# Patient Record
Sex: Female | Born: 1985 | Race: Black or African American | Hispanic: No | Marital: Single | Smoking: Never smoker
Health system: Southern US, Community
[De-identification: ages and names within clinical notes are randomized; demographics above are authoritative.]

## PROBLEM LIST (undated history)

## (undated) DIAGNOSIS — F419 Anxiety disorder, unspecified: Secondary | ICD-10-CM

## (undated) DIAGNOSIS — I10 Essential (primary) hypertension: Secondary | ICD-10-CM

## (undated) HISTORY — PX: APPENDECTOMY: SHX54

---

## 2005-09-15 ENCOUNTER — Other Ambulatory Visit: Admission: RE | Admit: 2005-09-15 | Discharge: 2005-09-15 | Payer: Self-pay | Admitting: Gynecology

## 2006-10-11 ENCOUNTER — Other Ambulatory Visit: Admission: RE | Admit: 2006-10-11 | Discharge: 2006-10-11 | Payer: Self-pay | Admitting: Gynecology

## 2007-01-25 ENCOUNTER — Emergency Department (HOSPITAL_COMMUNITY): Admission: EM | Admit: 2007-01-25 | Discharge: 2007-01-25 | Payer: Self-pay | Admitting: Emergency Medicine

## 2007-04-30 ENCOUNTER — Other Ambulatory Visit: Admission: RE | Admit: 2007-04-30 | Discharge: 2007-04-30 | Payer: Self-pay | Admitting: Obstetrics and Gynecology

## 2007-10-21 ENCOUNTER — Other Ambulatory Visit: Admission: RE | Admit: 2007-10-21 | Discharge: 2007-10-21 | Payer: Self-pay | Admitting: Gynecology

## 2008-06-12 ENCOUNTER — Encounter: Payer: Self-pay | Admitting: Gynecology

## 2008-06-12 ENCOUNTER — Ambulatory Visit: Payer: Self-pay | Admitting: Gynecology

## 2008-06-12 ENCOUNTER — Other Ambulatory Visit: Admission: RE | Admit: 2008-06-12 | Discharge: 2008-06-12 | Payer: Self-pay | Admitting: Gynecology

## 2008-06-26 ENCOUNTER — Ambulatory Visit: Payer: Self-pay | Admitting: Women's Health

## 2010-03-10 ENCOUNTER — Inpatient Hospital Stay (HOSPITAL_COMMUNITY): Admission: AD | Admit: 2010-03-10 | Discharge: 2010-03-10 | Payer: Self-pay | Admitting: Obstetrics and Gynecology

## 2010-07-29 ENCOUNTER — Emergency Department (HOSPITAL_BASED_OUTPATIENT_CLINIC_OR_DEPARTMENT_OTHER): Admission: EM | Admit: 2010-07-29 | Discharge: 2010-07-29 | Payer: Self-pay | Admitting: Emergency Medicine

## 2010-09-15 ENCOUNTER — Inpatient Hospital Stay (HOSPITAL_COMMUNITY): Admission: AD | Admit: 2010-09-15 | Discharge: 2010-04-02 | Payer: Self-pay | Admitting: Obstetrics and Gynecology

## 2010-12-21 LAB — RAPID STREP SCREEN (MED CTR MEBANE ONLY): Streptococcus, Group A Screen (Direct): NEGATIVE

## 2010-12-25 LAB — CBC
HCT: 34.9 % — ABNORMAL LOW (ref 36.0–46.0)
Hemoglobin: 13.4 g/dL (ref 12.0–15.0)
MCHC: 33.9 g/dL (ref 30.0–36.0)
MCV: 96.3 fL (ref 78.0–100.0)
Platelets: 171 10*3/uL (ref 150–400)
RBC: 3.62 MIL/uL — ABNORMAL LOW (ref 3.87–5.11)
RBC: 4.16 MIL/uL (ref 3.87–5.11)
WBC: 11.7 10*3/uL — ABNORMAL HIGH (ref 4.0–10.5)

## 2010-12-25 LAB — RPR: RPR Ser Ql: NONREACTIVE

## 2010-12-25 LAB — RH IMMUNE GLOB WKUP(>/=20WKS)(NOT WOMEN'S HOSP)

## 2012-01-18 ENCOUNTER — Ambulatory Visit (INDEPENDENT_AMBULATORY_CARE_PROVIDER_SITE_OTHER): Payer: BC Managed Care – PPO | Admitting: Physician Assistant

## 2012-01-18 VITALS — BP 118/75 | HR 82 | Temp 98.3°F | Resp 16 | Ht 65.25 in | Wt 202.4 lb

## 2012-01-18 DIAGNOSIS — H10029 Other mucopurulent conjunctivitis, unspecified eye: Secondary | ICD-10-CM

## 2012-01-18 DIAGNOSIS — H109 Unspecified conjunctivitis: Secondary | ICD-10-CM

## 2012-01-18 MED ORDER — OFLOXACIN 0.3 % OP SOLN: 1.0000 [drp] | Freq: Four times a day (QID) | OPHTHALMIC | Status: AC

## 2012-01-18 NOTE — Progress Notes (Signed)
  Subjective:    Patient ID: Amy Francis, female    DOB: September 24, 1986, 26 y.o.   MRN: [id number]  HPI Presents with 6 days of right eye pain, swelling, greenish/yellow drainage.  Awoke from nap with daughter (almost 27 years old) with matted eye.  "I knew it was pink eye."  Sister had drops left over from recent infection, which she used, but ran out, and symptoms recurred, now also in left eye.  No photophobia, visual change.  No URI-type symptoms. No FB history. Review of Systems As above.    Objective:   Physical Exam  Vital signs noted. Well-developed, well nourished BF who is awake, alert and oriented, in NAD. HEENT: [place]/AT, PERRL, EOMI.  Sclera and conjunctiva are injected.  Crusting on the lashes, L>R. Funduscopic exam normal bilaterally.  EAC are patent, TMs are normal in appearance. Nasal mucosa is pink and moist. OP is clear. Neck: supple, non-tender, no lymphadenopathy, thyromegaly.     Assessment & Plan:   1. Conjunctivitis, bacterial  ofloxacin (OCUFLOX) 0.3 % ophthalmic solution   Re-evaluate if symptoms worsen or persist.

## 2012-01-18 NOTE — Patient Instructions (Signed)
Wipe down ALL surfaces in your home, including faucets, the toilet flush, door knobs, etc. Wash your bed linens daily until your symptoms are completely resolved.

## 2012-04-05 ENCOUNTER — Ambulatory Visit (INDEPENDENT_AMBULATORY_CARE_PROVIDER_SITE_OTHER): Payer: BC Managed Care – PPO | Admitting: Family Medicine

## 2012-04-05 VITALS — BP 111/74 | HR 86 | Temp 98.4°F | Resp 16 | Ht 65.25 in | Wt 206.8 lb

## 2012-04-05 DIAGNOSIS — N390 Urinary tract infection, site not specified: Secondary | ICD-10-CM

## 2012-04-05 DIAGNOSIS — R109 Unspecified abdominal pain: Secondary | ICD-10-CM

## 2012-04-05 DIAGNOSIS — R8281 Pyuria: Secondary | ICD-10-CM

## 2012-04-05 DIAGNOSIS — R1084 Generalized abdominal pain: Secondary | ICD-10-CM

## 2012-04-05 LAB — POCT CBC
Granulocyte percent: 49.2 %G (ref 37–80)
HCT, POC: 48.3 % — AB (ref 37.7–47.9)
Hemoglobin: 15.2 g/dL (ref 12.2–16.2)
Lymph, poc: 2.3 (ref 0.6–3.4)
MCH, POC: 29.9 pg (ref 27–31.2)
MCHC: 31.5 g/dL — AB (ref 31.8–35.4)
MCV: 94.8 fL (ref 80–97)
MID (cbc): 0.5 (ref 0–0.9)
MPV: 8.8 fL (ref 0–99.8)
POC Granulocyte: 2.8 (ref 2–6.9)
POC LYMPH PERCENT: 41.1 %L (ref 10–50)
POC MID %: 9.7 %M (ref 0–12)
Platelet Count, POC: 238 10*3/uL (ref 142–424)
RBC: 5.09 M/uL (ref 4.04–5.48)
RDW, POC: 13 %
WBC: 5.6 10*3/uL (ref 4.6–10.2)

## 2012-04-05 LAB — POCT UA - MICROSCOPIC ONLY
Casts, Ur, LPF, POC: NEGATIVE
Crystals, Ur, HPF, POC: NEGATIVE
Yeast, UA: NEGATIVE

## 2012-04-05 LAB — POCT URINALYSIS DIPSTICK
Bilirubin, UA: NEGATIVE
Blood, UA: NEGATIVE
Glucose, UA: NEGATIVE
Ketones, UA: NEGATIVE
Nitrite, UA: NEGATIVE
Protein, UA: NEGATIVE
Spec Grav, UA: 1.02
Urobilinogen, UA: 1
pH, UA: 6

## 2012-04-05 LAB — POCT URINE PREGNANCY: Preg Test, Ur: NEGATIVE

## 2012-04-05 MED ORDER — CIPROFLOXACIN HCL 250 MG PO TABS: 250.0000 mg | ORAL_TABLET | Freq: Two times a day (BID) | ORAL | Status: AC

## 2012-04-05 MED ORDER — OMEPRAZOLE 20 MG PO CPDR: 20.0000 mg | DELAYED_RELEASE_CAPSULE | Freq: Every day | ORAL | Status: DC

## 2012-04-05 NOTE — Progress Notes (Signed)
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  No h/o abdominal problems.  LMP:  June 10th, she does not feel that she is pregnant. NO chest sx, urinary sx, vomiting, diarrhea, fever Has associated loss of appetite No recent travel Has 27 yo girl, single parent  Objective:  Chest is clear Heart is regular, with faint I/VI sem Abdomen:  Soft, no hsm, no masses, nontender Skin: warm and dry Results for orders placed in visit on 04/05/12  POCT CBC      Component Value Range   WBC 5.6  4.6 - 10.2 K/uL   Lymph, poc 2.3  0.6 - 3.4   POC LYMPH PERCENT 41.1  10 - 50 %L   MID (cbc) 0.5  0 - 0.9   POC MID % 9.7  0 - 12 %M   POC Granulocyte 2.8  2 - 6.9   Granulocyte percent 49.2  37 - 80 %G   RBC 5.09  4.04 - 5.48 M/uL   Hemoglobin 15.2  12.2 - 16.2 g/dL   HCT, POC 16.1 (*) 09.6 - 47.9 %   MCV 94.8  80 - 97 fL   MCH, POC 29.9  27 - 31.2 pg   MCHC 31.5 (*) 31.8 - 35.4 g/dL   RDW, POC 04.5     Platelet Count, POC 238  142 - 424 K/uL   MPV 8.8  0 - 99.8 fL  POCT URINALYSIS DIPSTICK      Component Value Range   Color, UA yellow     Clarity, UA cloudy     Glucose, UA neg     Bilirubin, UA neg     Ketones, UA neg     Spec Grav, UA 1.020     Blood, UA neg     pH, UA 6.0     Protein, UA neg     Urobilinogen, UA 1.0     Nitrite, UA neg     Leukocytes, UA Trace    POCT UA - MICROSCOPIC ONLY      Component Value Range   WBC, Ur, HPF, POC 8-12     RBC, urine, microscopic 1-3     Bacteria, U Microscopic 3+     Mucus, UA small     Epithelial cells, urine per micros 10-15     Crystals, Ur, HPF, POC neg     Casts, Ur, LPF, POC neg     Yeast, UA neg    POCT URINE PREGNANCY      Component Value Range   Preg Test, Ur Negative       Assessment:  Not acutely ill  UTI, malaise  Plan:  1. Abdominal cramps  POCT CBC, POCT urinalysis dipstick, POCT UA - Microscopic Only, POCT urine pregnancy, Urine culture

## 2012-04-07 LAB — URINE CULTURE: Colony Count: 40000

## 2012-12-26 ENCOUNTER — Ambulatory Visit (INDEPENDENT_AMBULATORY_CARE_PROVIDER_SITE_OTHER): Payer: BC Managed Care – PPO | Admitting: Physician Assistant

## 2012-12-26 VITALS — BP 104/80 | HR 88 | Temp 97.9°F | Resp 18 | Wt 221.0 lb

## 2012-12-26 DIAGNOSIS — N760 Acute vaginitis: Secondary | ICD-10-CM

## 2012-12-26 DIAGNOSIS — R350 Frequency of micturition: Secondary | ICD-10-CM

## 2012-12-26 DIAGNOSIS — R109 Unspecified abdominal pain: Secondary | ICD-10-CM

## 2012-12-26 LAB — POCT UA - MICROSCOPIC ONLY: Yeast, UA: NEGATIVE

## 2012-12-26 LAB — POCT WET PREP WITH KOH: Yeast Wet Prep HPF POC: NEGATIVE

## 2012-12-26 LAB — POCT URINALYSIS DIPSTICK
Bilirubin, UA: NEGATIVE
Leukocytes, UA: NEGATIVE
Nitrite, UA: NEGATIVE
pH, UA: 7.5

## 2012-12-26 MED ORDER — METRONIDAZOLE 500 MG PO TABS: 500.0000 mg | ORAL_TABLET | Freq: Two times a day (BID) | ORAL | Status: DC

## 2012-12-26 NOTE — Progress Notes (Signed)
Subjective:    Patient ID: Amy Francis, female    DOB: 19-Jan-1986, 27 y.o.   MRN: [id number]  HPI   Amy Francis is a pleasant 27 yr old female here with concern about lower abdominal pain and urinary urgency.  States she really noticed this yesterday during a meeting at work.  Had some lower abdominal pain and urgency, but not a lot of urine would come out.  Denies dysuria or hematuria.  A couple times last week she was woken up in the night with some abd discomfort and urinary urgency.  Also having some low back pain that she thinks may be related to the abdominal pain.  She doesn't recall ever having UTIs in the past.  She has had both BV and yeast before but doesn't remember when this was.  Has had similar symptoms before but nothing this persistent.  No fever, chills, nausea, vomiting, diarrhea.  Denies constipation, last bowel movement was yesterday and it was normal.    Denies vaginal discharge.  She is sexually active with one female partner.  No concern for STIs.  Was test a few weeks ago at her yearly exam.  LMP beginning of March.  Condoms only for contraception.     Review of Systems  Constitutional: Negative for fever and chills.  HENT: Negative.   Respiratory: Negative.   Gastrointestinal: Positive for abdominal pain (lower). Negative for nausea, vomiting, diarrhea and constipation.  Genitourinary: Positive for urgency and frequency. Negative for dysuria, hematuria and vaginal discharge.  Musculoskeletal: Positive for back pain (lower).  Neurological: Negative.        Objective:   Physical Exam  Vitals reviewed. Constitutional: She is oriented to person, place, and time. She appears well-developed and well-nourished. No distress.  HENT:  Head: Normocephalic and atraumatic.  Eyes: Conjunctivae are normal. No scleral icterus.  Cardiovascular: Normal rate, regular rhythm and normal heart sounds.   Pulmonary/Chest: Effort normal and breath sounds normal. She has no wheezes. She  has no rales.  Abdominal: Soft. Normal appearance and bowel sounds are normal. There is tenderness (slight) in the suprapubic area. There is no rigidity, no rebound, no guarding and no CVA tenderness.  Neurological: She is alert and oriented to person, place, and time.  Skin: Skin is warm and dry.  Psychiatric: She has a normal mood and affect. Her behavior is normal.     Filed Vitals:   12/26/12 0839  BP: 104/80  Pulse: 88  Temp: 97.9 F (36.6 C)  Resp: 18     Results for orders placed in visit on 12/26/12  POCT UA - MICROSCOPIC ONLY      Result Value Range   WBC, Ur, HPF, POC 0-1     RBC, urine, microscopic negative     Bacteria, U Microscopic trace     Mucus, UA negative     Epithelial cells, urine per micros 2-4     Crystals, Ur, HPF, POC negative     Casts, Ur, LPF, POC negative     Yeast, UA negative    POCT URINALYSIS DIPSTICK      Result Value Range   Color, UA yellow     Clarity, UA clear     Glucose, UA negative     Bilirubin, UA negative     Ketones, UA negative     Spec Grav, UA 1.020     Blood, UA negative     pH, UA 7.5     Protein, UA negative  Urobilinogen, UA 0.2     Nitrite, UA negative     Leukocytes, UA Negative    POCT URINE PREGNANCY      Result Value Range   Preg Test, Ur Negative    POCT WET PREP WITH KOH      Result Value Range   Trichomonas, UA Negative     Clue Cells Wet Prep HPF POC 4-7     Epithelial Wet Prep HPF POC 0-2     Yeast Wet Prep HPF POC neg     Bacteria Wet Prep HPF POC 2 plus     RBC Wet Prep HPF POC 0-1     WBC Wet Prep HPF POC 2-3     KOH Prep POC Negative         Assessment & Plan:  Bacterial vaginosis - Plan: metroNIDAZOLE (FLAGYL) 500 MG tablet  -- Wet prep with >20% clue cells.  Will treat with Flagyl x 7 days.  Discussed avoidance of etoh.  Suspect BV may be responsible for her lower abd discomfort and urinary symptoms as UA is clear.    Urinary frequency - Plan: POCT UA - Microscopic Only, POCT  urinalysis dipstick, Urine culture, POCT urine pregnancy, POCT Wet Prep with KOH  -- Pt very concerned that she has UTI.  Discussed with her that UA and urine micro are all normal.  No leuks, nitrite, or blood on either.  Will send urine culture to confirm.  Pt requests antibiotic for UTI today, discussed with her that I don't think it is appropriate at this time.  If culture returns showing infection, will send in antibiotic at that time.  Pt understands  Lower abdominal pain, unspecified laterality - Plan: POCT urine pregnancy, POCT Wet Prep with KOH  -- Exam is reassuring, no localized tenderness.  No fever, chills, NVD.  Suspect due to BV.    If any symptoms are worsening in the next 48 hours, pt will let us know.  Will follow-up on culture results and treat if necessary.

## 2012-12-26 NOTE — Patient Instructions (Addendum)
Begin taking the Flagyl (metronidazole) as directed.  Be sure to finish the full course.  DO NOT DRINK ALCOHOL WHILE TAKING THIS MEDICATION OR FOR 48 HOURS AFTER YOUR LAST DOSE.  Please let us know if you are worsening or not improving.  I will let you know if your urine culture shows anything.   Bacterial Vaginosis Bacterial vaginosis (BV) is a vaginal infection where the normal balance of bacteria in the vagina is disrupted. The normal balance is then replaced by an overgrowth of certain bacteria. There are several different kinds of bacteria that can cause BV. BV is the most common vaginal infection in women of childbearing age. CAUSES   The cause of BV is not fully understood. BV develops when there is an increase or imbalance of harmful bacteria.  Some activities or behaviors can upset the normal balance of bacteria in the vagina and put women at increased risk including:  Having a new sex partner or multiple sex partners.  Douching.  Using an intrauterine device (IUD) for contraception.  It is not clear what role sexual activity plays in the development of BV. However, women that have never had sexual intercourse are rarely infected with BV. Women do not get BV from toilet seats, bedding, swimming pools or from touching objects around them.  SYMPTOMS   Grey vaginal discharge.  A fish-like odor with discharge, especially after sexual intercourse.  Itching or burning of the vagina and vulva.  Burning or pain with urination.  Some women have no signs or symptoms at all. DIAGNOSIS  Your caregiver must examine the vagina for signs of BV. Your caregiver will perform lab tests and look at the sample of vaginal fluid through a microscope. They will look for bacteria and abnormal cells (clue cells), a pH test higher than 4.5, and a positive amine test all associated with BV.  RISKS AND COMPLICATIONS   Pelvic inflammatory disease (PID).  Infections following gynecology  surgery.  Developing HIV.  Developing herpes virus. TREATMENT  Sometimes BV will clear up without treatment. However, all women with symptoms of BV should be treated to avoid complications, especially if gynecology surgery is planned. Female partners generally do not need to be treated. However, BV may spread between female sex partners so treatment is helpful in preventing a recurrence of BV.   BV may be treated with antibiotics. The antibiotics come in either pill or vaginal cream forms. Either can be used with nonpregnant or pregnant women, but the recommended dosages differ. These antibiotics are not harmful to the baby.  BV can recur after treatment. If this happens, a second round of antibiotics will often be prescribed.  Treatment is important for pregnant women. If not treated, BV can cause a premature delivery, especially for a pregnant woman who had a premature birth in the past. All pregnant women who have symptoms of BV should be checked and treated.  For chronic reoccurrence of BV, treatment with a type of prescribed gel vaginally twice a week is helpful. HOME CARE INSTRUCTIONS   Finish all medication as directed by your caregiver.  Do not have sex until treatment is completed.  Tell your sexual partner that you have a vaginal infection. They should see their caregiver and be treated if they have problems, such as a mild rash or itching.  Practice safe sex. Use condoms. Only have 1 sex partner. PREVENTION  Basic prevention steps can help reduce the risk of upsetting the natural balance of bacteria in the vagina and  developing BV:  Do not have sexual intercourse (be abstinent).  Do not douche.  Use all of the medicine prescribed for treatment of BV, even if the signs and symptoms go away.  Tell your sex partner if you have BV. That way, they can be treated, if needed, to prevent reoccurrence. SEEK MEDICAL CARE IF:   Your symptoms are not improving after 3 days of  treatment.  You have increased discharge, pain, or fever. MAKE SURE YOU:   Understand these instructions.  Will watch your condition.  Will get help right away if you are not doing well or get worse. FOR MORE INFORMATION  Division of STD Prevention (DSTDP), Centers for Disease Control and Prevention: [employer] American Social Health Association (ASHA): www.ashastd.org  Document Released: 09/25/2005 Document Revised: 12/18/2011 Document Reviewed: 03/18/2009 [facility] Patient Information 2013 [place], [place].

## 2012-12-29 LAB — URINE CULTURE

## 2012-12-29 MED ORDER — CIPROFLOXACIN HCL 500 MG PO TABS: 500.0000 mg | ORAL_TABLET | Freq: Two times a day (BID) | ORAL | Status: DC

## 2012-12-29 NOTE — Progress Notes (Signed)
Addendum: Urine culture returned growing 40,000 colonies serratia.  Given the small number of bacteria, I am not sure if this actually represents infection.  Pt states that she continues to have urinary symptoms.  Will send Cipro to the pharmacy.  If symptoms persist after antibiotic treatment, pt will need to RTC for further eval.

## 2012-12-29 NOTE — Addendum Note (Signed)
Addended by: Godfrey Pick on: 12/29/2012 02:57 PM   Modules accepted: Orders

## 2013-01-22 ENCOUNTER — Other Ambulatory Visit: Payer: Self-pay | Admitting: Physician Assistant

## 2013-04-20 ENCOUNTER — Ambulatory Visit (INDEPENDENT_AMBULATORY_CARE_PROVIDER_SITE_OTHER): Payer: BC Managed Care – PPO | Admitting: Emergency Medicine

## 2013-04-20 VITALS — BP 117/74 | HR 102 | Temp 99.0°F | Resp 18 | Wt 221.0 lb

## 2013-04-20 DIAGNOSIS — L500 Allergic urticaria: Secondary | ICD-10-CM

## 2013-04-20 DIAGNOSIS — L509 Urticaria, unspecified: Secondary | ICD-10-CM

## 2013-04-20 MED ORDER — METHYLPREDNISOLONE ACETATE 80 MG/ML IJ SUSP
80.0000 mg | Freq: Once | INTRAMUSCULAR | Status: AC
  Administered 2013-04-20: 80 mg via INTRAMUSCULAR

## 2013-04-20 MED ORDER — HYDROXYZINE HCL 25 MG PO TABS: 25.0000 mg | ORAL_TABLET | Freq: Four times a day (QID) | ORAL | Status: DC | PRN

## 2013-04-20 MED ORDER — CYPROHEPTADINE HCL 4 MG PO TABS: 4.0000 mg | ORAL_TABLET | Freq: Four times a day (QID) | ORAL | Status: DC

## 2013-04-20 NOTE — Progress Notes (Signed)
[facility] [address], [place] [address] [phone]  Date:  04/20/2013   Name:  Amy Francis   DOB:  23-Dec-1985   MRN:  [id number]  PCP:  No primary provider on file.    Chief Complaint: Rash   History of Present Illness:  Amy Francis is a 27 y.o. very pleasant female patient who presents with the following:  Used a new laundry detergent on her comforter yesterday.  After making the bed, she took a nap.  She awoke itching and today has a generalized erythematous pruritic rash.  No associated allergic symptoms.  No improvement with over the counter medications or other home remedies. Denies other complaint or health concern today.   There are no active problems to display for this patient.   No past medical history on file.  Past Surgical History  Procedure Laterality Date  . Appendectomy    . Cesarean section      History  Substance Use Topics  . Smoking status: Never Smoker   . Smokeless tobacco: Not on file  . Alcohol Use: No    Family History  Problem Relation Age of Onset  . Diabetes Mother     No Known Allergies  Medication list has been reviewed and updated.  No current outpatient prescriptions on file prior to visit.   No current facility-administered medications on file prior to visit.    Review of Systems:  As per HPI, otherwise negative.    Physical Examination: Filed Vitals:   04/20/13 1321  BP: 117/74  Pulse: 102  Temp: 99 F (37.2 C)  Resp: 18   Filed Vitals:   04/20/13 1321  Weight: 221 lb (100.245 kg)   Body mass index is 36.51 kg/(m^2). Ideal Body Weight:    GEN: WDWN, NAD, Non-toxic, A & O x 3 HEENT: Atraumatic, Normocephalic. Neck supple. No masses, No LAD. Ears and Nose: No external deformity. CV: RRR, No M/G/R. No JVD. No thrill. No extra heart sounds. PULM: CTA B, no wheezes, crackles, rhonchi. No retractions. No resp. distress. No accessory muscle use. ABD: S, NT, ND, +BS. No rebound.  No HSM. EXTR: No c/c/e NEURO Normal gait.  PSYCH: Normally interactive. Conversant. Not depressed or anxious appearing.  Calm demeanor.  SKIN:  Generalized hives  Assessment and Plan: Urticaria Stop use of new detergent Periactin Depo medrol Vistaril   Signed,  Phillips Odor, MD

## 2013-04-20 NOTE — Patient Instructions (Addendum)

## 2013-06-02 ENCOUNTER — Ambulatory Visit (INDEPENDENT_AMBULATORY_CARE_PROVIDER_SITE_OTHER): Payer: BC Managed Care – PPO | Admitting: Emergency Medicine

## 2013-06-02 VITALS — BP 142/86 | HR 100 | Temp 98.7°F | Resp 16 | Ht 65.0 in | Wt 230.2 lb

## 2013-06-02 DIAGNOSIS — Z0000 Encounter for general adult medical examination without abnormal findings: Secondary | ICD-10-CM

## 2013-06-02 NOTE — Progress Notes (Signed)
[facility] [address] [place] [address] [phone]  Date:  06/02/2013   Name:  Amy Francis   DOB:  [birth year]   MRN:  [id number]  PCP:  No primary provider on file.    Chief Complaint: Annual Exam   History of Present Illness:  Amy Francis is a 27 y.o. very pleasant female patient who presents with the following:  No medications. No allergies.  Current PAP.  Denies other complaint or health concern today.   There are no active problems to display for this patient.   History reviewed. No pertinent past medical history.  Past Surgical History  Procedure Laterality Date  . Appendectomy    . Cesarean section      History  Substance Use Topics  . Smoking status: Never Smoker   . Smokeless tobacco: Not on file  . Alcohol Use: No    Family History  Problem Relation Age of Onset  . Diabetes Mother     No Known Allergies  Medication list has been reviewed and updated.  Current Outpatient Prescriptions on File Prior to Visit  Medication Sig Dispense Refill  . cyproheptadine (PERIACTIN) 4 MG tablet Take 1 tablet (4 mg total) by mouth 4 (four) times daily.  40 tablet  0  . hydrOXYzine (ATARAX/VISTARIL) 25 MG tablet Take 1 tablet (25 mg total) by mouth every 6 (six) hours as needed for itching.  40 tablet  0   No current facility-administered medications on file prior to visit.    Review of Systems:  As per HPI, otherwise negative.    Physical Examination: Filed Vitals:   06/02/13 1354  BP: 142/86  Pulse: 100  Temp: 98.7 F (37.1 C)  Resp: 16   Filed Vitals:   06/02/13 1354  Height: 5\' 5"  (1.651 m)  Weight: 230 lb 3.2 oz (104.418 kg)   Body mass index is 38.31 kg/(m^2). Ideal Body Weight: Weight in (lb) to have BMI = 25: 149.9  GEN: WDWN, NAD, Non-toxic, A & O x 3 HEENT: Atraumatic, Normocephalic. Neck supple. No masses, No LAD. Ears and Nose: No external deformity. CV: RRR, No M/G/R. No JVD. No thrill. No extra  heart sounds. PULM: CTA B, no wheezes, crackles, rhonchi. No retractions. No resp. distress. No accessory muscle use. ABD: S, NT, ND, +BS. No rebound. No HSM. EXTR: No c/c/e NEURO Normal gait.  PSYCH: Normally interactive. Conversant. Not depressed or anxious appearing.  Calm demeanor.    Assessment and Plan: Wellness examination   Signed,  Phillips Odor, MD

## 2013-06-02 NOTE — Progress Notes (Signed)
  Subjective:    Patient ID: Amy Francis, female    DOB: September 23, 1986, 27 y.o.   MRN: [id number]  HPI    Review of Systems     Objective:   Physical Exam        Assessment & Plan:   Tuberculosis Risk Questionnaire  NO} Were you born outside the [place] in one of the following parts of the world: [place], [place], [place], [place] or [place]?  NO    2.  Have you traveled outside the [place] and lived for more than one month in one of the following parts of the world: [place], [place], [place], [place] or [place]?  NO    3.no} Do you have a compromised immune system such as from any of the following conditions:HIV/AIDS, organ or bone marrow transplantation, diabetes, immunosuppressive medicines (e.g. Prednisone, Remicaide), leukemia, lymphoma, cancer of the head or neck, gastrectomy or jejunal bypass, end-stage renal disease (on dialysis), or silicosis?     4. {"Have you ever or do you plan on working in: a residential care center, a health care facility, a jail or prison or homeless shelter? NO    5. No Have you ever: injected illegal drugs, used crack cocaine, lived in a homeless shelter  or been in jail or prison? no    6. No Have you ever been exposed to anyone with infectious tuberculosis?    Tuberculosis Symptom Questionnaire  Do you currently have any of the following symptoms?  1. No Unexplained cough lasting more than 3 weeks?   2. No Unexplained fever lasting more than 3 weeks.   3. No Night Sweats (sweating that leaves the bedclothes and sheets wet)     4. No Shortness of Breath   5. No Chest Pain   6. No Unintentional weight loss    7. No Unexplained fatigue (very tired for no reason)

## 2013-06-09 ENCOUNTER — Telehealth: Payer: Self-pay | Admitting: Radiology

## 2013-06-09 ENCOUNTER — Ambulatory Visit (INDEPENDENT_AMBULATORY_CARE_PROVIDER_SITE_OTHER): Payer: BC Managed Care – PPO | Admitting: Family Medicine

## 2013-06-09 DIAGNOSIS — Z111 Encounter for screening for respiratory tuberculosis: Secondary | ICD-10-CM

## 2013-06-11 ENCOUNTER — Ambulatory Visit (INDEPENDENT_AMBULATORY_CARE_PROVIDER_SITE_OTHER): Payer: BC Managed Care – PPO | Admitting: *Deleted

## 2013-06-11 ENCOUNTER — Encounter: Payer: Self-pay | Admitting: *Deleted

## 2013-06-11 DIAGNOSIS — Z111 Encounter for screening for respiratory tuberculosis: Secondary | ICD-10-CM

## 2013-06-11 LAB — TB SKIN TEST: Induration: 0 mm

## 2013-06-11 NOTE — Progress Notes (Signed)
  Subjective:    Patient ID: Amy Francis, female    DOB: 1986-03-03, 27 y.o.   MRN: [id number]  HPI  Pt here for a tb read  Review of Systems     Objective:   Physical Exam        Assessment & Plan:

## 2013-06-12 NOTE — Telephone Encounter (Signed)
error 

## 2013-07-01 ENCOUNTER — Emergency Department (HOSPITAL_COMMUNITY)
Admission: EM | Admit: 2013-07-01 | Discharge: 2013-07-01 | Disposition: A | Discharge status: Home / Self Care | Payer: BC Managed Care – PPO | Attending: Emergency Medicine | Admitting: Emergency Medicine

## 2013-07-01 ENCOUNTER — Encounter (HOSPITAL_COMMUNITY): Payer: Self-pay | Admitting: Emergency Medicine

## 2013-07-01 ENCOUNTER — Emergency Department (HOSPITAL_COMMUNITY): Payer: BC Managed Care – PPO

## 2013-07-01 DIAGNOSIS — R0789 Other chest pain: Secondary | ICD-10-CM

## 2013-07-01 DIAGNOSIS — Z3202 Encounter for pregnancy test, result negative: Secondary | ICD-10-CM | POA: Insufficient documentation

## 2013-07-01 LAB — BASIC METABOLIC PANEL
BUN: 8 mg/dL (ref 6–23)
Calcium: 10.4 mg/dL (ref 8.4–10.5)
Creatinine, Ser: 0.83 mg/dL (ref 0.50–1.10)
GFR calc Af Amer: >90 mL/min (ref >90)
GFR calc non Af Amer: >90 mL/min (ref >90)

## 2013-07-01 LAB — CBC
HCT: 43.9 % (ref 36.0–46.0)
MCHC: 34.6 g/dL (ref 30.0–36.0)
MCV: 89.6 fL (ref 78.0–100.0)
RDW: 12.4 % (ref 11.5–15.5)

## 2013-07-01 LAB — POCT PREGNANCY, URINE: Preg Test, Ur: NEGATIVE

## 2013-07-01 MED ORDER — ALPRAZOLAM 0.5 MG PO TABS: 0.5000 mg | ORAL_TABLET | Freq: Three times a day (TID) | ORAL | Status: DC | PRN

## 2013-07-01 MED ORDER — ALPRAZOLAM 0.25 MG PO TABS
0.5000 mg | ORAL_TABLET | Freq: Once | ORAL | Status: AC
  Administered 2013-07-01: 0.5 mg via ORAL
  Filled 2013-07-01: qty 2

## 2013-07-01 NOTE — ED Provider Notes (Signed)
CSN: [id number]     Arrival date & time 07/01/13  1549 History   First MD Initiated Contact with Patient 07/01/13 1813     Chief Complaint  Patient presents with  . Chest Pain   (Consider location/radiation/quality/duration/timing/severity/associated sxs/prior Treatment) HPI Comments: This is a 27 year old female presenting to the emergency department for acute onset nonradiating chest tightness that began earlier this afternoon. Patient states the chest tightness began at work while she was looking up CP on the Internet because patient is undergoing treatment on a weight loss clinic and was told there was "something abnormal about her EKG and they may need to repeat it at some point." Patient states she's not having any chest pain or tightness at this time. She denies any shortness of breath today. She denies any associated nausea, vomiting, diaphoresis, abdominal pain. Patient states she's had this pain before when her "nerves got to her." Patient has no early family history of cardiac problems. She denies any recent travel, immobilization, surgery, unilateral leg swelling, hemoptysis, hx of PE/DVT. Pt does use NuvaRing as birthcontrol.   Patient is a 27 y.o. female presenting with chest pain.  Chest Pain Associated symptoms: no cough, no fever and no shortness of breath     History reviewed. No pertinent past medical history. Past Surgical History  Procedure Laterality Date  . Appendectomy    . Cesarean section     Family History  Problem Relation Age of Onset  . Diabetes Mother    History  Substance Use Topics  . Smoking status: Never Smoker   . Smokeless tobacco: Not on file  . Alcohol Use: No   OB History   Grav Para Term Preterm Abortions TAB SAB Ect Mult Living                 Review of Systems  Constitutional: Negative for fever and chills.  Respiratory: Positive for chest tightness. Negative for cough and shortness of breath.   Cardiovascular: Negative for chest pain  and leg swelling.  All other systems reviewed and are negative.    Allergies  Review of patient's allergies indicates no known allergies.  Home Medications   Current Outpatient Rx  Name  Route  Sig  Dispense  Refill  . Multiple Vitamins-Minerals (MULTIVITAMIN WITH MINERALS) tablet   Oral   Take 1 tablet by mouth daily.          BP 115/69  Pulse 95  Temp(Src) 98.5 F (36.9 C) (Oral)  Resp 17  SpO2 100%  LMP 04/21/2013 Physical Exam  Constitutional: She is oriented to person, place, and time. She appears well-developed and well-nourished. No distress.  HENT:  Head: Normocephalic and atraumatic.  Right Ear: External ear normal.  Left Ear: External ear normal.  Mouth/Throat: Oropharynx is clear and moist.  Eyes: Conjunctivae and EOM are normal. Pupils are equal, round, and reactive to light.  Neck: Neck supple.  Cardiovascular: Normal rate, regular rhythm, normal heart sounds and intact distal pulses.   Pulmonary/Chest: Effort normal and breath sounds normal. No respiratory distress.  Abdominal: Soft. Bowel sounds are normal. She exhibits no distension. There is no tenderness. There is no rebound and no guarding.  Musculoskeletal: Normal range of motion. She exhibits no edema.  Neurological: She is alert and oriented to person, place, and time.  Skin: Skin is warm and dry. She is not diaphoretic.  Psychiatric: Her mood appears anxious.    ED Course  Procedures (including critical care time) Medications  ALPRAZolam Prudy Feeler)  tablet 0.5 mg (0.5 mg Oral Given 07/01/13 1911)    Labs Review Labs Reviewed  CBC - Abnormal; Notable for the following:    Hemoglobin 15.2 (*)    All other components within normal limits  BASIC METABOLIC PANEL  POCT I-STAT TROPONIN I  POCT PREGNANCY, URINE   Imaging Review Dg Chest 2 View  07/01/2013   CLINICAL DATA:  Tightness in the chest. Anxiety.  EXAM: CHEST  2 VIEW  COMPARISON:  The  FINDINGS: Faintly prominent AP window contour is  likely incidental. Cardiac and mediastinal contour is otherwise normal. The lungs appear clear. No pleural effusion noted.  IMPRESSION: 1. No significant abnormality identified. Slightly prominent appearance of the AP window contour is likely incidental.   Electronically Signed   By: Herbie Baltimore   On: 07/01/2013 17:02    Date: 07/01/2013  Rate: 121  Rhythm: sinus tachycardia with short PR  QRS Axis: normal  Intervals: Long QTc  ST/T Wave abnormalities: normal  Conduction Disutrbances:none  Narrative Interpretation:   Old EKG Reviewed: none available   Date: 07/01/2013  Rate: 86  Rhythm: normal sinus rhythm  QRS Axis: normal  Intervals: normal  ST/T Wave abnormalities: normal  Conduction Disutrbances:none  Narrative Interpretation:   Old EKG Reviewed: tachycardia improved from previous    MDM   1. Chest pain, atypical     Afebrile, NAD, non-toxic appearing, AAOx4. Initially tachycardiac and anxious appearing. CP reproducible with palpation. Patient is to be discharged with recommendation to follow up with PCP in regards to today's hospital visit. Chest pain is not likely of cardiac or pulmonary etiology d/t presentation, presentation non-concerning for PE, VSS, no tracheal deviation, no JVD or new murmur, RRR, breath sounds equal bilaterally, EKG without acute abnormalities, negative troponin, and negative CXR. Tachycardia improved after xanax administration. Patient able to ambulate with O2 saturations maintained at 100% without SOB or DOE. Pt has been advised to follow up with PCP and return to the ED is CP becomes exertional, associated with diaphoresis or nausea, radiates to left jaw/arm, worsens or becomes concerning in any way. Pt appears reliable for follow up and is agreeable to discharge. Case has been discussed with and seen by Dr. Rubin Payor who agrees with the above plan to discharge. Patient is stable at time of discharge      Jeannetta Ellis, PA-C 07/02/13  0133

## 2013-07-01 NOTE — ED Notes (Signed)
Pt c/o CP that she describes as tightness starting today; pt denies SOB

## 2013-07-01 NOTE — ED Notes (Signed)
Pt Pulse Oximetry monitored while ambulating pt in hall ways of ED with no change in Pulse Ox reading. 100% prior to ambulation, during ambulation and at completion of ambulation.

## 2013-07-01 NOTE — ED Notes (Signed)
Pt states she gets chest pain when she is anxious. Pt denies recent sickness or injury. Pt denies chest pain at this time. Pt alert and mentating appropriately. Pt speaking in clear complete sentences.

## 2013-07-02 ENCOUNTER — Telehealth (HOSPITAL_COMMUNITY): Payer: Self-pay | Admitting: Emergency Medicine

## 2013-07-03 NOTE — ED Provider Notes (Signed)
Medical screening examination/treatment/procedure(s) were performed by non-physician practitioner and as supervising physician I was immediately available for consultation/collaboration.  Geron Mulford R. Keili Hasten, MD 07/03/13 1613 

## 2013-07-09 ENCOUNTER — Emergency Department (HOSPITAL_COMMUNITY)
Admission: EM | Admit: 2013-07-09 | Discharge: 2013-07-09 | Disposition: A | Discharge status: Home / Self Care | Payer: BC Managed Care – PPO | Attending: Emergency Medicine | Admitting: Emergency Medicine

## 2013-07-09 ENCOUNTER — Encounter (HOSPITAL_COMMUNITY): Payer: Self-pay | Admitting: Adult Health

## 2013-07-09 DIAGNOSIS — R202 Paresthesia of skin: Secondary | ICD-10-CM

## 2013-07-09 DIAGNOSIS — R209 Unspecified disturbances of skin sensation: Secondary | ICD-10-CM | POA: Insufficient documentation

## 2013-07-09 NOTE — ED Provider Notes (Signed)
CSN: [id number]     Arrival date & time 07/09/13  0033 History   First MD Initiated Contact with Patient 07/09/13 0136     Chief Complaint  Patient presents with  . Leg Pain   (Consider location/radiation/quality/duration/timing/severity/associated sxs/prior Treatment) HPI Comments: Today around 11:00 patient noticed a vague team to leave sensation to the lateral aspect of her left calf.  Has been persistent throughout the day.  It.  Has not changed in intensity, nor abated.  Denies any injury, history of diabetes, hypertension, recent travel  Patient is a 27 y.o. female presenting with leg pain. The history is provided by the patient.  Leg Pain Location:  Leg Time since incident:  1 day Leg location:  L leg Pain details:    Quality:  Tingling   Radiates to:  Does not radiate   Severity:  Mild   Onset quality:  Sudden   Duration:  1 day   Timing:  Constant   Progression:  Unchanged Chronicity:  New Dislocation: no   Foreign body present:  No foreign bodies Associated symptoms: no fever     History reviewed. No pertinent past medical history. Past Surgical History  Procedure Laterality Date  . Appendectomy    . Cesarean section     Family History  Problem Relation Age of Onset  . Diabetes Mother    History  Substance Use Topics  . Smoking status: Never Smoker   . Smokeless tobacco: Not on file  . Alcohol Use: No   OB History   Grav Para Term Preterm Abortions TAB SAB Ect Mult Living                 Review of Systems  Constitutional: Negative for fever.  Musculoskeletal: Negative for myalgias and joint swelling.  Skin: Negative for rash and wound.  Neurological: Positive for numbness. Negative for weakness.  All other systems reviewed and are negative.    Allergies  Review of patient's allergies indicates no known allergies.  Home Medications   Current Outpatient Rx  Name  Route  Sig  Dispense  Refill  . Multiple Vitamins-Minerals (MULTIVITAMIN WITH  MINERALS) tablet   Oral   Take 1 tablet by mouth daily.         Marland Kitchen ALPRAZolam (XANAX) 0.5 MG tablet   Oral   Take 1 tablet (0.5 mg total) by mouth 3 (three) times daily as needed for sleep.   5 tablet   0    BP 129/83  Pulse 95  Temp(Src) 98 F (36.7 C) (Oral)  Resp 18  SpO2 100%  LMP 04/21/2013 Physical Exam  Nursing note and vitals reviewed. Constitutional: She is oriented to person, place, and time. She appears well-developed and well-nourished.  HENT:  Head: Normocephalic.  Eyes: Pupils are equal, round, and reactive to light.  Neck: Normal range of motion.  Cardiovascular: Normal rate.   Pulmonary/Chest: Effort normal.  Musculoskeletal: She exhibits no edema and no tenderness.       Legs: Area of paresthesia without breaking the skin discoloration, change in temperature  Neurological: She is alert and oriented to person, place, and time.  Skin: Skin is warm. No rash noted. No erythema.    ED Course  Procedures (including critical care time) Labs Review Labs Reviewed - No data to display Imaging Review No results found.  MDM   1. Paresthesia of lower extremity     Patient is negative for DVT.  Risk factors.  This is all been discussed with  the patient.  She has been given discharge instructions on paresthesia.  She will return if she develops new or worsening symptoms    Arman Filter, NP 07/09/13 [phone]

## 2013-07-09 NOTE — ED Provider Notes (Addendum)
Medical screening examination/treatment/procedure(s) were performed by non-physician practitioner and as supervising physician I was immediately available for consultation/collaboration.  Flint Melter, MD 07/23/13 [phone]

## 2013-07-09 NOTE — ED Notes (Signed)
Present with left lower leg from mid calf to foot "funny feeling, its not pain, its just uncomfortable and it hurts worse when I press something against it" no redness or swelling noted. Began at 2 pm yesterday.

## 2013-07-14 ENCOUNTER — Encounter: Payer: Self-pay | Admitting: Family

## 2013-07-14 ENCOUNTER — Ambulatory Visit (INDEPENDENT_AMBULATORY_CARE_PROVIDER_SITE_OTHER): Payer: BC Managed Care – PPO | Admitting: Family

## 2013-07-14 VITALS — BP 128/82 | HR 81 | Temp 98.2°F | Resp 18 | Ht 65.75 in | Wt 221.0 lb

## 2013-07-14 DIAGNOSIS — F411 Generalized anxiety disorder: Secondary | ICD-10-CM | POA: Insufficient documentation

## 2013-07-14 DIAGNOSIS — Z23 Encounter for immunization: Secondary | ICD-10-CM

## 2013-07-14 DIAGNOSIS — R0789 Other chest pain: Secondary | ICD-10-CM | POA: Insufficient documentation

## 2013-07-14 DIAGNOSIS — E669 Obesity, unspecified: Secondary | ICD-10-CM

## 2013-07-14 DIAGNOSIS — R200 Anesthesia of skin: Secondary | ICD-10-CM

## 2013-07-14 DIAGNOSIS — R202 Paresthesia of skin: Secondary | ICD-10-CM | POA: Insufficient documentation

## 2013-07-14 DIAGNOSIS — R209 Unspecified disturbances of skin sensation: Secondary | ICD-10-CM

## 2013-07-14 NOTE — Assessment & Plan Note (Addendum)
Pt counseled on importance of healthy diet, exercise, weight loss.  She verbalizes understanding. We discussed importance of reaching goal weight of 150-160.

## 2013-07-14 NOTE — Patient Instructions (Addendum)
Please complete lab work prior to leaving. Follow up at your earliest convenience for a fasting physical.   Welcome to [facility]!

## 2013-07-14 NOTE — Assessment & Plan Note (Addendum)
We discussed possibility of adding a medication for anxiety, however she declines.  We also discussed possibility of referral to a therapist, but she declines at this time.

## 2013-07-14 NOTE — Progress Notes (Signed)
Subjective:    Patient ID: Amy Francis, female    DOB: [birth year], 27 y.o.   MRN: [id number]  HPI  Amy Francis is a 27 year old female who presents today to establish care. She was evaluated in the ED with chief complaint of numbness of the left lateral leg.  A doppler was negative for DVT.  She reports that the numbness has resolved.  She reports that she had numbness on the contralateral leg the week before.  She denies associated falls/tripping, blurring, diplopia or tremors.   One week prior, she was evaluated for chest pain.  Tryponin was negative, cxr was negative. Was told due to anxiety and muscle pain.  Reports generally she can control her anxiety.  Has struggled with anxiety for some time.  Fixates on things and "I need an answer right away."  Was given rx in ED for xanax, but wishes not to take medication. "I want to keep managing this on my own."   Obesity- reports that she was recently evaluated at [facility] in their weight loss clinic.  Had normal TSH drawn there.  Mother died from complications of DM. Pt had an 11 pound baby but denies hx of gestational diabetes.     Review of Systems  Constitutional: Negative for unexpected weight change.  HENT: Negative for hearing loss and congestion.   Eyes: Negative for visual disturbance.  Respiratory: Negative for cough.   Cardiovascular: Negative for chest pain.  Gastrointestinal: Negative for vomiting, diarrhea and constipation.  Genitourinary: Negative for dysuria, frequency and menstrual problem.  Musculoskeletal: Negative for back pain.  Skin: Negative for rash.  Neurological: Negative for headaches.  Hematological: Negative for adenopathy.  Psychiatric/Behavioral:       Denies depression       No past medical history on file.  History   Social History  . Marital Status: Single    Spouse Name: N/A    Number of Children: N/A  . Years of Education: N/A   Occupational History  . Not on file.   Social  History Main Topics  . Smoking status: Never Smoker   . Smokeless tobacco: Never Used  . Alcohol Use: No  . Drug Use: No  . Sexual Activity: Yes    Birth Control/ Protection: Condom   Other Topics Concern  . 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 Appendectomy    . Cesarean section      Family History  Problem Relation Age of Onset  . Diabetes Mother     deceased from DM complication    No Known Allergies  Current Outpatient Prescriptions on File Prior to Visit  Medication Sig Dispense Refill  . ALPRAZolam (XANAX) 0.5 MG tablet Take 1 tablet (0.5 mg total) by mouth 3 (three) times daily as needed for sleep.  5 tablet  0  . Multiple Vitamins-Minerals (MULTIVITAMIN WITH MINERALS) tablet Take 1 tablet by mouth daily.       No current facility-administered medications on file prior to visit.    BP 128/82  Pulse 81  Temp(Src) 98.2 F (36.8 C) (Oral)  Resp 18  Ht 5' 5.75" (1.67 m)  Wt 221 lb 0.6 oz (100.263 kg)  BMI 35.95 kg/m2  SpO2 99%  LMP 06/18/2013    Objective:   Physical Exam  Constitutional: She is oriented to person, place, and time. She appears well-developed and well-nourished. No distress.  HENT:  Head: Normocephalic and atraumatic.  Eyes: No scleral icterus.  Neck: No thyromegaly present.  Cardiovascular: Normal rate and regular rhythm.   No murmur heard. Pulmonary/Chest: Effort normal and breath sounds normal. No respiratory distress. She has no wheezes. She has no rales. She exhibits no tenderness.  Musculoskeletal: She exhibits no edema.  Lymphadenopathy:    She has no cervical adenopathy.  Neurological: She is alert and oriented to person, place, and time.  Reflex Scores:      Patellar reflexes are 2+ on the right side and 2+ on the left side. Bilateral LE  strength is 5/5, sensation grossly intact bilateral LE  Skin: Skin is warm and dry.  Scar left cheek  Psychiatric: She has a normal mood and affect. Her behavior is normal. Judgment and thought content normal.          Assessment & Plan:

## 2013-07-14 NOTE — Assessment & Plan Note (Addendum)
Resolved. ? Due to lumbar disc disease vs. Anxiety. Reviewed labs from [facility] medical center. She had a b12 level >1200 recently at their lab. If recurrent or worsening symptoms would need to consider additional imaging.  MS would be in differential if recurrent symptoms.

## 2013-07-14 NOTE — Assessment & Plan Note (Signed)
Resolved. EKG is performed today and no acute changes are noted.

## 2013-07-21 ENCOUNTER — Telehealth: Payer: Self-pay | Admitting: Family

## 2013-07-21 DIAGNOSIS — E559 Vitamin D deficiency, unspecified: Secondary | ICD-10-CM | POA: Insufficient documentation

## 2013-07-21 MED ORDER — CHOLECALCIFEROL 25 MCG (1000 UT) PO TABS: 3000.0000 [IU] | ORAL_TABLET | Freq: Every day | ORAL | Status: DC

## 2013-07-21 NOTE — Telephone Encounter (Signed)
See my chart message

## 2013-07-31 ENCOUNTER — Institutional Professional Consult (permissible substitution): Payer: BC Managed Care – PPO | Admitting: Cardiology

## 2013-10-10 ENCOUNTER — Ambulatory Visit: Payer: BC Managed Care – PPO | Admitting: Physician Assistant

## 2013-10-10 DIAGNOSIS — Z0289 Encounter for other administrative examinations: Secondary | ICD-10-CM

## 2013-10-13 ENCOUNTER — Ambulatory Visit (INDEPENDENT_AMBULATORY_CARE_PROVIDER_SITE_OTHER): Payer: BC Managed Care – PPO | Admitting: Family

## 2013-10-13 ENCOUNTER — Encounter: Payer: Self-pay | Admitting: Family

## 2013-10-13 VITALS — BP 124/80 | HR 84 | Temp 98.3°F | Resp 16 | Ht 65.75 in | Wt 228.0 lb

## 2013-10-13 DIAGNOSIS — G5600 Carpal tunnel syndrome, unspecified upper limb: Secondary | ICD-10-CM

## 2013-10-13 MED ORDER — MELOXICAM 7.5 MG PO TABS: 7.5000 mg | ORAL_TABLET | Freq: Every day | ORAL | Status: DC

## 2013-10-13 NOTE — Progress Notes (Signed)
   Subjective:    Patient ID: Amy Francis, female    DOB: 03/30/1986, 28 y.o.   MRN: [id number]  HPI  Amy Francis is a 28 yr old female who presents today to discuss intermittent bilateral hand/forearm numbness. Numbness usually occurs while sleeping, and sometimes while she is holding her cell phone in her hand at texting etc.    Review of Systems    see HPI  No past medical history on file.  History   Social History  . Marital Status: Single    Spouse Name: N/A    Number of Children: N/A  . Years of Education: N/A   Occupational History  . Not on file.   Social History Main Topics  . Smoking status: Never Smoker   . Smokeless tobacco: Never Used  . Alcohol Use: No  . Drug Use: No  . Sexual Activity: Yes    Birth Control/ Protection: Condom   Other Topics Concern  . 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 Appendectomy    . Cesarean section      Family History  Problem Relation Age of Onset  . Diabetes Mother     deceased from DM complication    No Known Allergies  Current Outpatient Prescriptions on File Prior to Visit  Medication Sig Dispense Refill  . Cholecalciferol 1000 UNITS tablet Take 3 tablets (3,000 Units total) by mouth daily.      . Multiple Vitamins-Minerals (MULTIVITAMIN WITH MINERALS) tablet Take 1 tablet by mouth daily.       No current facility-administered medications on file prior to visit.    BP 124/80  Pulse 84  Temp(Src) 98.3 F (36.8 C) (Oral)  Resp 16  Ht 5' 5.75" (1.67 m)  Wt 228 lb 0.6 oz (103.438 kg)  BMI 37.09 kg/m2  SpO2 99%    Objective:   Physical Exam  Constitutional: She is oriented to person, place, and time. She appears well-developed and well-nourished. No distress.  Cardiovascular: Normal rate and regular rhythm.   No murmur  heard. Pulmonary/Chest: Breath sounds normal. No respiratory distress. She has no wheezes. She has no rales. She exhibits no tenderness.  Musculoskeletal: She exhibits no edema.  Neurological: She is alert and oriented to person, place, and time.  Neg tinels, + phalans bilaterally          Assessment & Plan:

## 2013-10-13 NOTE — Patient Instructions (Signed)
Wear wrist braces at night and as you are able during the day. Start meloxicam.  Call if symptoms worsen or if symptoms do not improve.

## 2013-10-14 DIAGNOSIS — G5600 Carpal tunnel syndrome, unspecified upper limb: Secondary | ICD-10-CM | POA: Insufficient documentation

## 2013-10-14 NOTE — Assessment & Plan Note (Addendum)
New. Trial of meloxicam, bilateral wrist braces provided.  Consider orthopedic referral if symptoms worsen or if no improvement.

## 2013-10-27 ENCOUNTER — Telehealth: Payer: Self-pay | Admitting: *Deleted

## 2013-10-27 MED ORDER — CITALOPRAM HYDROBROMIDE 20 MG PO TABS: ORAL_TABLET | ORAL | Status: DC

## 2013-10-27 NOTE — Telephone Encounter (Signed)
Pt left message that she has decided that she would like to try a medication for anxiety.  Please advise.

## 2013-10-27 NOTE — Telephone Encounter (Signed)
Rx sent to her pharmacy for citalopram.  Please instruct pt to start 1/2 tablet once daily for 1 week and then increase to a full tablet once daily on week two as tolerated.  Common side effects include such as nausea, drowsiness and weight gain. She should go to the ER if she develops thoughts of hurting herself or others.   Plan follow up in 1 month to evaluate progress.

## 2013-10-27 NOTE — Telephone Encounter (Signed)
Left message on voicemail to return my call.  

## 2013-10-28 NOTE — Telephone Encounter (Signed)
Left message for pt to return my call.

## 2013-11-03 NOTE — Telephone Encounter (Signed)
Pt returned my call and voices understanding. Scheduled f/u for 12/01/13 at 5:15pm.

## 2013-12-01 ENCOUNTER — Ambulatory Visit: Payer: BC Managed Care – PPO | Admitting: Family

## 2013-12-01 DIAGNOSIS — Z0289 Encounter for other administrative examinations: Secondary | ICD-10-CM

## 2014-03-09 ENCOUNTER — Emergency Department (HOSPITAL_COMMUNITY)
Admission: EM | Admit: 2014-03-09 | Discharge: 2014-03-09 | Disposition: A | Discharge status: Home / Self Care | Payer: BC Managed Care – PPO | Attending: Emergency Medicine | Admitting: Emergency Medicine

## 2014-03-09 ENCOUNTER — Telehealth: Payer: Self-pay | Admitting: Family

## 2014-03-09 ENCOUNTER — Emergency Department (HOSPITAL_COMMUNITY): Payer: Self-pay

## 2014-03-09 ENCOUNTER — Encounter (HOSPITAL_COMMUNITY): Payer: Self-pay | Admitting: Emergency Medicine

## 2014-03-09 ENCOUNTER — Ambulatory Visit: Payer: BC Managed Care – PPO | Admitting: Family

## 2014-03-09 DIAGNOSIS — R51 Headache: Secondary | ICD-10-CM | POA: Insufficient documentation

## 2014-03-09 DIAGNOSIS — R519 Headache, unspecified: Secondary | ICD-10-CM

## 2014-03-09 MED ORDER — NAPROXEN 375 MG PO TABS: 375.0000 mg | ORAL_TABLET | Freq: Two times a day (BID) | ORAL | Status: DC

## 2014-03-09 MED ORDER — KETOROLAC TROMETHAMINE 60 MG/2ML IM SOLN
60.0000 mg | Freq: Once | INTRAMUSCULAR | Status: AC
  Administered 2014-03-09: 60 mg via INTRAMUSCULAR
  Filled 2014-03-09: qty 2

## 2014-03-09 NOTE — ED Notes (Signed)
Pt states that she will follow up with PCP for CT head.

## 2014-03-09 NOTE — ED Notes (Signed)
Dr. Palumbo in to see pt.  

## 2014-03-09 NOTE — ED Notes (Signed)
The pt has had headaches for 2 months.  She hs had this headache since 1700 and now she is stating the headache is not very bad

## 2014-03-09 NOTE — ED Notes (Addendum)
Pt to CT via stretcher

## 2014-03-09 NOTE — Telephone Encounter (Signed)
Please contact pt to arrange ED follow up visit.

## 2014-03-09 NOTE — ED Notes (Signed)
Patient is alert and orientedx4.  Patient was explained discharge instructions and they understood them with no questions.   

## 2014-03-09 NOTE — ED Provider Notes (Signed)
CSN: [id number]     Arrival date & time 03/09/14  0039 History   First MD Initiated Contact with Patient 03/09/14 0105     Chief Complaint  Patient presents with  . Headache     (Consider location/radiation/quality/duration/timing/severity/associated sxs/prior Treatment) Patient is a 28 y.o. female presenting with headaches. The history is provided by the patient.  Headache Pain location:  Frontal Quality:  Sharp Radiates to:  Does not radiate Onset quality:  Gradual Duration:  8 weeks Timing:  Intermittent Progression:  Unchanged Chronicity:  New Similar to prior headaches: yes   Context: not activity   Relieved by:  Nothing Worsened by:  Nothing tried Ineffective treatments:  None tried Associated symptoms: no congestion, no cough, no dizziness, no fever, no myalgias, no neck stiffness, no numbness, no paresthesias, no photophobia and no weakness   Associated symptoms comment:  No changes in speech or cognition Risk factors: no family hx of SAH     History reviewed. No pertinent past medical history. Past Surgical History  Procedure Laterality Date  . Appendectomy    . Cesarean section     Family History  Problem Relation Age of Onset  . Diabetes Mother     deceased from DM complication   History  Substance Use Topics  . Smoking status: Never Smoker   . Smokeless tobacco: Never Used  . Alcohol Use: No   OB History   Grav Para Term Preterm Abortions TAB SAB Ect Mult Living                 Review of Systems  Constitutional: Negative for fever.  HENT: Negative for congestion.   Eyes: Negative for photophobia.  Respiratory: Negative for cough.   Musculoskeletal: Negative for myalgias and neck stiffness.  Neurological: Positive for headaches. Negative for dizziness, tremors, syncope, facial asymmetry, speech difficulty, numbness and paresthesias.  All other systems reviewed and are negative.     Allergies  Review of patient's allergies indicates no known  allergies.  Home Medications   Prior to Admission medications   Medication Sig Start Date End Date Taking? Authorizing Provider  Cholecalciferol 1000 UNITS tablet Take 3 tablets (3,000 Units total) by mouth daily. 07/21/13  Yes Sandford Craze, NP  citalopram (CELEXA) 20 MG tablet 1/2 tab by mouth once daily for 1 week, then increase to full tablet once daily 10/27/13  Yes Sandford Craze, NP  Multiple Vitamins-Minerals (MULTIVITAMIN WITH MINERALS) tablet Take 1 tablet by mouth daily.   Yes Historical Provider, MD   BP 132/79  Pulse 82  Temp(Src) 98.2 F (36.8 C) (Oral)  Resp 18  Ht 5\' 5"  (1.651 m)  Wt 220 lb (99.791 kg)  BMI 36.61 kg/m2  SpO2 100%  LMP 02/09/2014 Physical Exam  Constitutional: She is oriented to person, place, and time. She appears well-developed and well-nourished. No distress.  HENT:  Head: Normocephalic and atraumatic.  Mouth/Throat: Oropharynx is clear and moist. No oropharyngeal exudate.  Eyes: Conjunctivae and EOM are normal. Pupils are equal, round, and reactive to light.  Neck: Normal range of motion. Neck supple.  Cardiovascular: Normal rate, regular rhythm and intact distal pulses.   Pulmonary/Chest: Effort normal and breath sounds normal. She has no wheezes. She has no rales.  Abdominal: Soft. Bowel sounds are normal. There is no tenderness. There is no rebound and no guarding.  Musculoskeletal: Normal range of motion. She exhibits no edema and no tenderness.  Lymphadenopathy:    She has no cervical adenopathy.  Neurological: She is  alert and oriented to person, place, and time. She has normal reflexes. No cranial nerve deficit.  Skin: Skin is warm and dry.  Psychiatric: She has a normal mood and affect.    ED Course  Procedures (including critical care time) Labs Review Labs Reviewed - No data to display  Imaging Review No results found.   EKG Interpretation None      MDM   Final diagnoses:  None    lOW RISK REFUSES HEAD CT.   FOLLOW UP WITH YOUR [place] FOR [place]ING CARE   Lennon Richins Smitty CordsK Ivery Nanney-Rasch, MD 03/09/14 [phone]

## 2014-03-09 NOTE — Discharge Instructions (Signed)

## 2014-03-09 NOTE — ED Notes (Addendum)
Back from CT, pt declined CT, wants to speak with MD prior to deciding, EDP aware.

## 2014-03-09 NOTE — ED Notes (Signed)
C/o HAs, pinpoints pain to R temporal/mastoid area, onset ~ 3 months ago, "comes and goes, goes away completely when it does go away, better now than earlier", denies eye correction, has an eye MD, "thought it would go away when she had a cavity filled ~ 3 months ago, but it did not", denies caffeine use, (denies: nvd, fever, dizziness, blurry vision)  BP noted to be high tonight in ED, some temporary relief with ibuprofen and acetaminophen, "here after reading about HAs on google". Alert, NAD, calm, interactive, speaking in clear complete sentences, skin W&D.

## 2014-03-09 NOTE — Telephone Encounter (Signed)
Patient called in this morning stating that she had headaches so I had scheduled her to be seen this evening. I didn't see this until now. Rescheduled patient to 03/16/14 and told patient to call us if she needed [place] sooner.

## 2014-03-16 ENCOUNTER — Telehealth: Payer: Self-pay | Admitting: Family

## 2014-03-16 ENCOUNTER — Ambulatory Visit: Payer: Self-pay | Admitting: Family

## 2014-03-16 DIAGNOSIS — Z0289 Encounter for other administrative examinations: Secondary | ICD-10-CM

## 2014-03-16 NOTE — Telephone Encounter (Signed)
Please send recall letter. 

## 2014-03-16 NOTE — Telephone Encounter (Signed)
Pt NOS appt today @ 6:00

## 2014-03-17 ENCOUNTER — Encounter: Payer: Self-pay | Admitting: Family

## 2014-03-17 NOTE — Telephone Encounter (Signed)
Letter sent.

## 2014-04-08 ENCOUNTER — Telehealth: Payer: Self-pay

## 2014-04-08 NOTE — Telephone Encounter (Signed)
Patient called and left a message yesterday leaving her DOB and phone number. I returned her call and asked what she was needing from us. Patient states she needs a copy of the paper that states she did not need a TB test and a copy of her physical. States she is also going to be faxing us over a form that she needs filled out with the physical paperwork. Advised her that she would need to fax us over the written request for records found on our website along with the paper she needs filled out by a doctor. Patient understood and will be faxing both to us.

## 2014-04-21 NOTE — Telephone Encounter (Signed)
Medical records has been waiting on fax from patient but nothing received.

## 2014-05-14 ENCOUNTER — Encounter (HOSPITAL_COMMUNITY): Payer: Self-pay | Admitting: Emergency Medicine

## 2014-05-14 ENCOUNTER — Emergency Department (HOSPITAL_COMMUNITY)
Admission: EM | Admit: 2014-05-14 | Discharge: 2014-05-14 | Disposition: A | Discharge status: Home / Self Care | Payer: Self-pay | Attending: Emergency Medicine | Admitting: Emergency Medicine

## 2014-05-14 ENCOUNTER — Telehealth: Payer: Self-pay | Admitting: Family

## 2014-05-14 DIAGNOSIS — M542 Cervicalgia: Secondary | ICD-10-CM | POA: Insufficient documentation

## 2014-05-14 DIAGNOSIS — M436 Torticollis: Secondary | ICD-10-CM | POA: Insufficient documentation

## 2014-05-14 MED ORDER — METHOCARBAMOL 500 MG PO TABS: 1000.0000 mg | ORAL_TABLET | Freq: Four times a day (QID) | ORAL | Status: DC | PRN

## 2014-05-14 MED ORDER — KETOROLAC TROMETHAMINE 60 MG/2ML IM SOLN
30.0000 mg | Freq: Once | INTRAMUSCULAR | Status: AC
  Administered 2014-05-14: 30 mg via INTRAMUSCULAR
  Filled 2014-05-14: qty 2

## 2014-05-14 NOTE — ED Notes (Signed)
Pt arrives with neck pain present for about a weak, denies recent injuries. Unable to sleep, states its too painful to turn head. Tylenol and ibuprofen ineffective.

## 2014-05-14 NOTE — Telephone Encounter (Signed)
Pt needs follow up on Monday please.

## 2014-05-14 NOTE — ED Provider Notes (Signed)
Medical screening examination/treatment/procedure(s) were performed by non-physician practitioner and as supervising physician I was immediately available for consultation/collaboration.   EKG Interpretation None       Ethelda ChickMartha K Linker, MD 05/14/14 [phone]

## 2014-05-14 NOTE — ED Provider Notes (Signed)
CSN: [id number]     Arrival date & time 05/14/14  0541 History   None    Chief Complaint  Patient presents with  . Neck Pain     (Consider location/radiation/quality/duration/timing/severity/associated sxs/prior Treatment) HPI  Amy Francis is a 28 y.o. female complaining of left-sided neck pain worsening over the course of the week. Patient does not recall any trauma or abnormal movements, exercise or strenuous lifting. She's been taking Motrin at home with little relief. She denies any numbness, weakness, tingling or paresthesias. States that the pain is exacerbated by movement, rated at 8/10. No prior similar episodes.   History reviewed. No pertinent past medical history. Past Surgical History  Procedure Laterality Date  . Appendectomy    . Cesarean section     Family History  Problem Relation Age of Onset  . Diabetes Mother     deceased from DM complication   History  Substance Use Topics  . Smoking status: Never Smoker   . Smokeless tobacco: Never Used  . Alcohol Use: No   OB History   Grav Para Term Preterm Abortions TAB SAB Ect Mult Living                 Review of Systems  10 systems reviewed and found to be negative, except as noted in the HPI.   Allergies  Review of patient's allergies indicates no known allergies.  Home Medications   Prior to Admission medications   Medication Sig Start Date End Date Taking? Authorizing Provider  methocarbamol (ROBAXIN) 500 MG tablet Take 2 tablets (1,000 mg total) by mouth 4 (four) times daily as needed (Pain). 05/14/14   Jermia Rigsby, PA-C   BP 134/89  Pulse 93  Temp(Src) 98.6 F (37 C) (Oral)  Resp 17  Ht  (1.651 m)  Wt 220 lb (99.791 kg)  BMI 36.61 kg/m2  SpO2 100%  LMP 04/22/2014 Physical Exam  Nursing note and vitals reviewed. Constitutional: She is oriented to person, place, and time. She appears well-developed and well-nourished. No distress.  HENT:  Head: Normocephalic.  Eyes: Conjunctivae  and EOM are normal.  Neck:  No midline C-spine  tenderness to palpation or step-offs appreciated.   Spasm and tenderness to palpation along the left trapezius.  Spurling test is negative bilaterally.     Cardiovascular: Normal rate.   Pulmonary/Chest: Effort normal. No stridor.  Musculoskeletal: Normal range of motion.  Neurological: She is alert and oriented to person, place, and time.  Full-strength and biceps, triceps and grip strength bilaterally. Sensation is grossly intact.  Psychiatric: She has a normal mood and affect.    ED Course  Procedures (including critical care time) Labs Review Labs Reviewed - No data to display  Imaging Review No results found.   EKG Interpretation None      MDM   Final diagnoses:  Torticollis, acute    Filed Vitals:   05/14/14 0547  BP: 134/89  Pulse: 93  Temp: 98.6 F (37 C)  TempSrc: Oral  Resp: 17  Height:  (1.651 m)  Weight: 220 lb (99.791 kg)  SpO2: 100%    Medications  ketorolac (TORADOL) injection 30 mg (not administered)    Amy Francis is a 28 y.o. female presenting with atraumatic left trapezius spasm and torticollis. No signs of cervical nerve involvement. Recommend NSAIDs, Robaxin, heat and massage.  Evaluation does not show pathology that would require ongoing emergent intervention or inpatient treatment. Pt is hemodynamically stable and mentating appropriately. Discussed  findings and plan with patient/guardian, who agrees with care plan. All questions answered. Return precautions discussed and outpatient follow up given.   New Prescriptions   METHOCARBAMOL (ROBAXIN) 500 MG TABLET    Take 2 tablets (1,000 mg total) by mouth 4 (four) times daily as needed (Pain).         Wynetta Emery, PA-C 05/14/14 1610  Wynetta Emery, PA-C 05/14/14 [phone]

## 2014-05-14 NOTE — Discharge Instructions (Signed)
For pain control you may take up to 800mg  of Motrin (also known as ibuprofen). That is usually 4 over the counter pills,  3 times a day. Take with food to minimize stomach irritation   For breakthrough pain you may take Robaxin. Do not drink alcohol, drive or operate heavy machinery when taking Robaxin.  Please follow with your primary care doctor in the next 2 days for a check-up. They must obtain records for further management.   Do not hesitate to return to the Emergency Department for any new, worsening or concerning symptoms.    Torticollis, Acute You have suddenly (acutely) developed a twisted neck (torticollis). This is usually a self-limited condition. CAUSES  Acute torticollis may be caused by malposition, trauma or infection. Most commonly, acute torticollis is caused by sleeping in an awkward position. Torticollis may also be caused by the flexion, extension or twisting of the neck muscles beyond their normal position. Sometimes, the exact cause may not be known. SYMPTOMS  Usually, there is pain and limited movement of the neck. Your neck may twist to one side. DIAGNOSIS  The diagnosis is often made by physical examination. X-rays, CT scans or MRIs may be done if there is a history of trauma or concern of infection. TREATMENT  For a common, stiff neck that develops during sleep, treatment is focused on relaxing the contracted neck muscle. Medications (including shots) may be used to treat the problem. Most cases resolve in several days. Torticollis usually responds to conservative physical therapy. If left untreated, the shortened and spastic neck muscle can cause deformities in the face and neck. Rarely, surgery is required. HOME CARE INSTRUCTIONS   Use over-the-counter and prescription medications as directed by your caregiver.  Do stretching exercises and massage the neck as directed by your caregiver.  Follow up with physical therapy if needed and as directed by your  caregiver. SEEK IMMEDIATE MEDICAL CARE IF:   You develop difficulty breathing or noisy breathing (stridor).  You drool, develop trouble swallowing or have pain with swallowing.  You develop numbness or weakness in the hands or feet.  You have changes in speech or vision.  You have problems with urination or bowel movements.  You have difficulty walking.  You have a fever.  You have increased pain. MAKE SURE YOU:   Understand these instructions.  Will watch your condition.  Will get help right away if you are not doing well or get worse. Document Released: 09/22/2000 Document Revised: 12/18/2011 Document Reviewed: 11/03/2009 [facility] Patient Information 2015 [place], [place]. This information is not intended to replace advice given to you by your health care provider. Make sure you discuss any questions you have with your health care provider.

## 2014-05-15 NOTE — Telephone Encounter (Signed)
Left message for patient to return my call.

## 2014-05-20 NOTE — Telephone Encounter (Signed)
This lady has never called me back and she was supposed to be seen this week.

## 2014-05-20 NOTE — Telephone Encounter (Signed)
Left message for patient to return my call.

## 2014-07-24 ENCOUNTER — Other Ambulatory Visit: Payer: Self-pay

## 2015-03-27 ENCOUNTER — Emergency Department (HOSPITAL_COMMUNITY): Payer: No Typology Code available for payment source

## 2015-03-27 ENCOUNTER — Emergency Department (HOSPITAL_COMMUNITY)
Admission: EM | Admit: 2015-03-27 | Discharge: 2015-03-27 | Disposition: A | Discharge status: Home / Self Care | Payer: No Typology Code available for payment source | Attending: Emergency Medicine | Admitting: Emergency Medicine

## 2015-03-27 ENCOUNTER — Encounter (HOSPITAL_COMMUNITY): Payer: Self-pay | Admitting: *Deleted

## 2015-03-27 DIAGNOSIS — M549 Dorsalgia, unspecified: Secondary | ICD-10-CM | POA: Diagnosis present

## 2015-03-27 DIAGNOSIS — R079 Chest pain, unspecified: Secondary | ICD-10-CM | POA: Diagnosis not present

## 2015-03-27 LAB — COMPREHENSIVE METABOLIC PANEL
ALBUMIN: 4.2 g/dL (ref 3.5–5.0)
ALK PHOS: 63 U/L (ref 38–126)
ALT: 25 U/L (ref 14–54)
ANION GAP: 9 (ref 5–15)
AST: 24 U/L (ref 15–41)
BILIRUBIN TOTAL: 1 mg/dL (ref 0.3–1.2)
BUN: 7 mg/dL (ref 6–20)
CHLORIDE: 100 mmol/L — AB (ref 101–111)
CO2: 27 mmol/L (ref 22–32)
Calcium: 9.6 mg/dL (ref 8.9–10.3)
Creatinine, Ser: 0.84 mg/dL (ref 0.44–1.00)
GFR calc non Af Amer: >60 mL/min (ref >60)
Glucose, Bld: 87 mg/dL (ref 65–99)
POTASSIUM: 3.7 mmol/L (ref 3.5–5.1)
Sodium: 136 mmol/L (ref 135–145)
TOTAL PROTEIN: 8 g/dL (ref 6.5–8.1)

## 2015-03-27 LAB — CBC WITH DIFFERENTIAL/PLATELET
BASOS ABS: 0.1 10*3/uL (ref 0.0–0.1)
BASOS PCT: 1 % (ref 0–1)
EOS PCT: 2 % (ref 0–5)
Eosinophils Absolute: 0.2 10*3/uL (ref 0.0–0.7)
HEMATOCRIT: 42 % (ref 36.0–46.0)
HEMOGLOBIN: 14.2 g/dL (ref 12.0–15.0)
Lymphocytes Relative: 29 % (ref 12–46)
Lymphs Abs: 2.9 10*3/uL (ref 0.7–4.0)
MCH: 30.6 pg (ref 26.0–34.0)
MCHC: 33.8 g/dL (ref 30.0–36.0)
MCV: 90.5 fL (ref 78.0–100.0)
MONOS PCT: 7 % (ref 3–12)
Monocytes Absolute: 0.7 10*3/uL (ref 0.1–1.0)
NEUTROS ABS: 6.2 10*3/uL (ref 1.7–7.7)
Neutrophils Relative %: 61 % (ref 43–77)
Platelets: 271 10*3/uL (ref 150–400)
RBC: 4.64 MIL/uL (ref 3.87–5.11)
RDW: 12.2 % (ref 11.5–15.5)
WBC: 10 10*3/uL (ref 4.0–10.5)

## 2015-03-27 LAB — D-DIMER, QUANTITATIVE (NOT AT ARMC): D-Dimer, Quant: 0.42 ug/mL-FEU (ref 0.00–0.48)

## 2015-03-27 MED ORDER — CYCLOBENZAPRINE HCL 10 MG PO TABS: 10.0000 mg | ORAL_TABLET | Freq: Two times a day (BID) | ORAL | Status: DC | PRN

## 2015-03-27 MED ORDER — IBUPROFEN 800 MG PO TABS: 800.0000 mg | ORAL_TABLET | Freq: Three times a day (TID) | ORAL | Status: DC

## 2015-03-27 MED ORDER — IBUPROFEN 400 MG PO TABS
400.0000 mg | ORAL_TABLET | Freq: Once | ORAL | Status: AC
  Administered 2015-03-27: 400 mg via ORAL
  Filled 2015-03-27: qty 1

## 2015-03-27 NOTE — ED Notes (Signed)
Pt reports upper back pain for 2 days . Pt rfeports pain increases with inhalation.  Pt denies travel over 4 hrs  and any hx DVT or PE.

## 2015-03-27 NOTE — Discharge Instructions (Signed)
Chest Wall Pain Chest wall pain is pain in or around the bones and muscles of your chest. It may take up to 6 weeks to get better. It may take longer if you must stay physically active in your work and activities.  CAUSES  Chest wall pain may happen on its own. However, it may be caused by:  A viral illness like the flu.  Injury.  Coughing.  Exercise.  Arthritis.  Fibromyalgia.  Shingles. HOME CARE INSTRUCTIONS   Avoid overtiring physical activity. Try not to strain or perform activities that cause pain. This includes any activities using your chest or your abdominal and side muscles, especially if heavy weights are used.  Put ice on the sore area.  Put ice in a plastic bag.  Place a towel between your skin and the bag.  Leave the ice on for 15-20 minutes per hour while awake for the first 2 days.  Only take over-the-counter or prescription medicines for pain, discomfort, or fever as directed by your caregiver. SEEK IMMEDIATE MEDICAL CARE IF:   Your pain increases, or you are very uncomfortable.  You have a fever.  Your chest pain becomes worse.  You have new, unexplained symptoms.  You have nausea or vomiting.  You feel sweaty or lightheaded.  You have a cough with phlegm (sputum), or you cough up blood. MAKE SURE YOU:   Understand these instructions.  Will watch your condition.  Will get help right away if you are not doing well or get worse. Document Released: 09/25/2005 Document Revised: 12/18/2011 Document Reviewed: 05/22/2011 [facility] Patient Information 2015 [place], [place]. This information is not intended to replace advice given to you by your health care provider. Make sure you discuss any questions you have with your health care provider. Back Pain, Adult Low back pain is very common. About 1 in 5 people have back pain.The cause of low back pain is rarely dangerous. The pain often gets better over time.About half of people with a sudden onset of  back pain feel better in just 2 weeks. About 8 in 10 people feel better by 6 weeks.  CAUSES Some common causes of back pain include:  Strain of the muscles or ligaments supporting the spine.  Wear and tear (degeneration) of the spinal discs.  Arthritis.  Direct injury to the back. DIAGNOSIS Most of the time, the direct cause of low back pain is not known.However, back pain can be treated effectively even when the exact cause of the pain is unknown.Answering your caregiver's questions about your overall health and symptoms is one of the most accurate ways to make sure the cause of your pain is not dangerous. If your caregiver needs more information, he or she may order lab work or imaging tests (X-rays or MRIs).However, even if imaging tests show changes in your back, this usually does not require surgery. HOME CARE INSTRUCTIONS For many people, back pain returns.Since low back pain is rarely dangerous, it is often a condition that people can learn to [facility] their own.   Remain active. It is stressful on the back to sit or stand in one place. Do not sit, drive, or stand in one place for more than 30 minutes at a time. Take short walks on level surfaces as soon as pain allows.Try to increase the length of time you walk each day.  Do not stay in bed.Resting more than 1 or 2 days can delay your recovery.  Do not avoid exercise or work.Your body is made  to move. It is not dangerous to be active, even though your back may hurt. Your back will likely heal faster if you return to being active before your pain is gone. °· Pay attention to your body when you  bend and lift. Many people have less discomfort when lifting if they bend their knees, keep the load close to their bodies, and avoid twisting. Often, the most comfortable positions are those that put less stress on your recovering back. °· Find a comfortable position to sleep. Use a firm mattress and lie on your side with your knees  slightly bent. If you lie on your back, put a pillow under your knees. °· Only take over-the-counter or prescription medicines as directed by your caregiver. Over-the-counter medicines to reduce pain and inflammation are often the most helpful. Your caregiver may prescribe muscle relaxant drugs. These medicines help dull your pain so you can more quickly return to your normal activities and healthy exercise. °· Put ice on the injured area. °¨ Put ice in a plastic bag. °¨ Place a towel between your skin and the bag. °¨ Leave the ice on for 15-20 minutes, 03-04 times a day for the first 2 to 3 days. After that, ice and heat may be alternated to reduce pain and spasms. °· Ask your caregiver about trying back exercises and gentle massage. This may be of some benefit. °· Avoid feeling anxious or stressed. Stress increases muscle tension and can worsen back pain. It is important to recognize when you are anxious or stressed and learn ways to manage it. Exercise is a great option. °SEEK MEDICAL CARE IF: °· You have pain that is not relieved with rest or medicine. °· You have pain that does not improve in 1 week. °· You have new symptoms. °· You are generally not feeling well. °SEEK IMMEDIATE MEDICAL CARE IF:  °· You have pain that radiates from your back into your legs. °· You develop new bowel or bladder control problems. °· You have unusual weakness or numbness in your arms or legs. °· You develop nausea or vomiting. °· You develop abdominal pain. °· You feel faint. °Document Released: 09/25/2005 Document Revised: 03/26/2012 Document Reviewed: 01/27/2014 °ExitCare® Patient Information ©2015 [employer], LLC. This information is not intended to replace advice given to you by your health care provider. Make sure you discuss any questions you have with your health care provider. ° °

## 2015-03-27 NOTE — ED Notes (Signed)
Declined W/C at D/C and was escorted to lobby by RN. 

## 2015-03-27 NOTE — ED Notes (Signed)
PT refused for staff to escort her to front lobby.

## 2015-03-27 NOTE — ED Notes (Signed)
K.Sofia NP at Pt bed side.

## 2015-03-27 NOTE — ED Notes (Signed)
Pt fully ambulatory prior to meds.

## 2015-03-27 NOTE — ED Provider Notes (Signed)
CSN: [id number]     Arrival date & time 03/27/15  1001 History  This chart was scribed for non-physician practitioner, Ok Edwards, PA-C  working with Rolland Porter, MD by Freida Busman, ED Scribe. This patient was seen in room TR11C/TR11C and the patient's care was started at 11:40 AM.    Chief Complaint  Patient presents with  . Back Pain   The history is provided by the patient. No language interpreter was used.     HPI Comments:  Amy Francis is a 29 y.o. female who presents to the Emergency Department complaining of moderate mid back pain for 3 days. She notes the pain is worse with inhalation. Pt returned from 3 hour road trip days ago, believes pain may have been caused by the way she slept in the car. She denies swelling of her BLE, fever, cough. She also denies use of BCP, smoking, h/o HTN, or DVT/PE. Pt also reports mild central CP that started today. Denies SOB. No alleviating factors noted.  No PCP  History reviewed. No pertinent past medical history. Past Surgical History  Procedure Laterality Date  . Appendectomy    . Cesarean section    . Cesarean section  2011   Family History  Problem Relation Age of Onset  . Diabetes Mother     deceased from DM complication   History  Substance Use Topics  . Smoking status: Never Smoker   . Smokeless tobacco: Never Used  . Alcohol Use: No   OB History    No data available     Review of Systems  Constitutional: Negative for fever.  Respiratory: Negative for cough and shortness of breath.   Cardiovascular: Positive for chest pain. Negative for leg swelling.  Musculoskeletal: Positive for back pain.  All other systems reviewed and are negative.     Allergies  Review of patient's allergies indicates no known allergies.  Home Medications   Prior to Admission medications   Medication Sig Start Date End Date Taking? Authorizing Provider  methocarbamol (ROBAXIN) 500 MG tablet Take 2 tablets (1,000 mg total) by  mouth 4 (four) times daily as needed (Pain). 05/14/14   Nicole Pisciotta, PA-C   There were no vitals taken for this visit. Physical Exam  Constitutional: She is oriented to person, place, and time. She appears well-developed and well-nourished. No distress.  HENT:  Head: Normocephalic and atraumatic.  Eyes: Conjunctivae are normal.  Cardiovascular: Normal rate, regular rhythm and normal heart sounds.   Pulmonary/Chest: Effort normal and breath sounds normal.  Abdominal: She exhibits no distension.  Neurological: She is alert and oriented to person, place, and time.  Skin: Skin is warm and dry.  Psychiatric: She has a normal mood and affect.  Nursing note and vitals reviewed.   ED Course  Procedures   DIAGNOSTIC STUDIES:  Oxygen Saturation is 100% on RA, normal by my interpretation.    COORDINATION OF CARE:  11:43 AM Will order blood work. Discussed treatment plan with pt at bedside and pt agreed to plan.  Labs Review Labs Reviewed  COMPREHENSIVE METABOLIC PANEL - Abnormal; Notable for the following:    Chloride 100 (*)    All other components within normal limits  CBC WITH DIFFERENTIAL/PLATELET  D-DIMER, QUANTITATIVE (NOT AT [facility])   Results for orders placed or performed during the hospital encounter of 03/27/15  CBC with Differential/Platelet  Result Value Ref Range   WBC 10.0 4.0 - 10.5 K/uL   RBC 4.64 3.87 - 5.11 MIL/uL  Hemoglobin 14.2 12.0 - 15.0 g/dL   HCT 16.1 09.6 - 04.5 %   MCV 90.5 78.0 - 100.0 fL   MCH 30.6 26.0 - 34.0 pg   MCHC 33.8 30.0 - 36.0 g/dL   RDW 40.9 81.1 - 91.4 %   Platelets 271 150 - 400 K/uL   Neutrophils Relative % 61 43 - 77 %   Neutro Abs 6.2 1.7 - 7.7 K/uL   Lymphocytes Relative 29 12 - 46 %   Lymphs Abs 2.9 0.7 - 4.0 K/uL   Monocytes Relative 7 3 - 12 %   Monocytes Absolute 0.7 0.1 - 1.0 K/uL   Eosinophils Relative 2 0 - 5 %   Eosinophils Absolute 0.2 0.0 - 0.7 K/uL   Basophils Relative 1 0 - 1 %   Basophils Absolute 0.1 0.0 - 0.1  K/uL  D-dimer, quantitative (not at [facility])  Result Value Ref Range   D-Dimer, Quant 0.42 0.00 - 0.48 ug/mL-FEU  Comprehensive metabolic panel  Result Value Ref Range   Sodium 136 135 - 145 mmol/L   Potassium 3.7 3.5 - 5.1 mmol/L   Chloride 100 (L) 101 - 111 mmol/L   CO2 27 22 - 32 mmol/L   Glucose, Bld 87 65 - 99 mg/dL   BUN 7 6 - 20 mg/dL   Creatinine, Ser 7.82 0.44 - 1.00 mg/dL   Calcium 9.6 8.9 - 95.6 mg/dL   Total Protein 8.0 6.5 - 8.1 g/dL   Albumin 4.2 3.5 - 5.0 g/dL   AST 24 15 - 41 U/L   ALT 25 14 - 54 U/L   Alkaline Phosphatase 63 38 - 126 U/L   Total Bilirubin 1.0 0.3 - 1.2 mg/dL   GFR calc non Af Amer >60 >60 mL/min   GFR calc Af Amer >60 >60 mL/min   Anion gap 9 5 - 15   Dg Chest 2 View  03/27/2015   CLINICAL DATA:  Upper left chest pain radiating to the back, worse with inspiration. Symptoms for 3 days.  EXAM: CHEST  2 VIEW  COMPARISON:  None.  FINDINGS: The heart size is mildly enlarged allowing for degree of hypoaeration. Both lungs are clear. The visualized skeletal structures are unremarkable.  IMPRESSION: No active cardiopulmonary disease.   Electronically Signed   By: Christiana Pellant M.D.   On: 03/27/2015 12:40    Imaging Review No results found.   EKG Interpretation   Date/Time:  Saturday March 27 2015 11:53:27 EDT Ventricular Rate:  104 PR Interval:  162 QRS Duration: 76 QT Interval:  342 QTC Calculation: 449 R Axis:     Text Interpretation:  Sinus tachycardia Otherwise normal ECG Confirmed by  Fayrene Fearing  MD, MARK ([id number]) on 03/27/2015 12:00:17 PM      MDM  Pt given ibuprofen for discomfort.  ddimer is normal.   EKg is normal.   Pt advised to see Macario Carls for recheck this week.  I will treat with muscle relaxer and antiinflamatory.   Final diagnoses:  Chest pain     I personally performed the services in this documentation, which was scribed in my presence.  The recorded information has been reviewed and considered.   Barnet Pall.     Lonia Skinner [place], PA-C 03/27/15 1337  Rolland Porter, MD 04/06/15 1600

## 2015-03-27 NOTE — ED Notes (Signed)
Pt ambulatory post med dosage.

## 2015-03-28 ENCOUNTER — Telehealth: Payer: Self-pay | Admitting: Family

## 2015-03-28 NOTE — Telephone Encounter (Signed)
Please contact pt to arrange ED follow up this week. 

## 2015-03-29 NOTE — Telephone Encounter (Signed)
Appointment scheduled for 03/30/15.

## 2015-03-30 ENCOUNTER — Ambulatory Visit: Payer: Self-pay | Admitting: Family

## 2015-03-31 ENCOUNTER — Encounter: Payer: Self-pay | Admitting: Family

## 2015-03-31 ENCOUNTER — Telehealth: Payer: Self-pay | Admitting: Family

## 2015-03-31 NOTE — Telephone Encounter (Signed)
Pt was no show 03/30/15 7:45am, hospital f/u, follow up 15 appt, pt has not rescheduled, charge?

## 2015-03-31 NOTE — Telephone Encounter (Signed)
Yes please

## 2015-12-08 ENCOUNTER — Emergency Department
Admission: EM | Admit: 2015-12-08 | Discharge: 2015-12-09 | Disposition: A | Discharge status: Home / Self Care | Payer: No Typology Code available for payment source | Attending: Emergency Medicine | Admitting: Emergency Medicine

## 2015-12-08 ENCOUNTER — Encounter: Payer: Self-pay | Admitting: Emergency Medicine

## 2015-12-08 DIAGNOSIS — N390 Urinary tract infection, site not specified: Secondary | ICD-10-CM | POA: Diagnosis not present

## 2015-12-08 DIAGNOSIS — Z3202 Encounter for pregnancy test, result negative: Secondary | ICD-10-CM | POA: Insufficient documentation

## 2015-12-08 DIAGNOSIS — Z791 Long term (current) use of non-steroidal anti-inflammatories (NSAID): Secondary | ICD-10-CM | POA: Insufficient documentation

## 2015-12-08 DIAGNOSIS — R350 Frequency of micturition: Secondary | ICD-10-CM | POA: Diagnosis present

## 2015-12-08 LAB — URINALYSIS COMPLETE WITH MICROSCOPIC (ARMC ONLY)
Bacteria, UA: NONE SEEN
Bilirubin Urine: NEGATIVE
GLUCOSE, UA: NEGATIVE mg/dL
HGB URINE DIPSTICK: NEGATIVE
KETONES UR: NEGATIVE mg/dL
NITRITE: NEGATIVE
Protein, ur: NEGATIVE mg/dL
RBC / HPF: NONE SEEN RBC/hpf (ref 0–5)
SPECIFIC GRAVITY, URINE: 1.013 (ref 1.005–1.030)
pH: 6 (ref 5.0–8.0)

## 2015-12-08 LAB — PREGNANCY, URINE: Preg Test, Ur: NEGATIVE

## 2015-12-08 MED ORDER — PHENAZOPYRIDINE HCL 100 MG PO TABS
100.0000 mg | ORAL_TABLET | Freq: Once | ORAL | Status: AC
  Administered 2015-12-08: 100 mg via ORAL
  Filled 2015-12-08: qty 1

## 2015-12-08 MED ORDER — IBUPROFEN 600 MG PO TABS
600.0000 mg | ORAL_TABLET | Freq: Once | ORAL | Status: AC
  Administered 2015-12-08: 600 mg via ORAL
  Filled 2015-12-08: qty 1

## 2015-12-08 MED ORDER — PHENAZOPYRIDINE HCL 100 MG PO TABS: 100.0000 mg | ORAL_TABLET | Freq: Three times a day (TID) | ORAL | Status: DC | PRN

## 2015-12-08 MED ORDER — SULFAMETHOXAZOLE-TRIMETHOPRIM 800-160 MG PO TABS: 1.0000 | ORAL_TABLET | Freq: Two times a day (BID) | ORAL | Status: DC

## 2015-12-08 MED ORDER — SULFAMETHOXAZOLE-TRIMETHOPRIM 800-160 MG PO TABS
1.0000 | ORAL_TABLET | Freq: Once | ORAL | Status: AC
  Administered 2015-12-08: 1 via ORAL

## 2015-12-08 MED ORDER — SULFAMETHOXAZOLE-TRIMETHOPRIM 800-160 MG PO TABS
ORAL_TABLET | ORAL | Status: AC
  Administered 2015-12-08: 23:00:00 1 via ORAL
  Filled 2015-12-08: qty 1

## 2015-12-08 NOTE — ED Notes (Signed)
Pt arrived to the ED for complaints of lower abdominal discomfort, urinary frequency and pain. Pt states that it feels like a UTI. Pt is AOx4 in no apparent distress.

## 2015-12-08 NOTE — ED Provider Notes (Signed)
[facility] Emergency Department Provider Note  ____________________________________________  Time seen: Approximately 10:53 PM  I have reviewed the triage vital signs and the nursing notes.   HISTORY  Chief Complaint Urinary Frequency    HPI Amy Francis is a 30 y.o. female , NAD, reports to the emergency department with 24-hour history of sudden onset lower abdominal discomfort, urinary frequency, dysuria and urinary hesitancy. Has not had any fevers, chills, body aches. Has not noted any hematuria or vaginal discharge. Has had a little bit of lower back pain earlier in the evening which prompted her to come to the emergency department.   History reviewed. No pertinent past medical history.  Patient Active Problem List   Diagnosis Date Noted  . Carpal tunnel syndrome 10/14/2013  . Unspecified vitamin D deficiency 07/21/2013  . Anxiety state, unspecified 07/14/2013  . Paresthesia 07/14/2013  . Atypical chest pain 07/14/2013  . Obesity (BMI 30-39.9) 07/14/2013    Past Surgical History  Procedure Laterality Date  . Appendectomy    . Cesarean section    . Cesarean section  2011    Current Outpatient Rx  Name  Route  Sig  Dispense  Refill  . cyclobenzaprine (FLEXERIL) 10 MG tablet   Oral   Take 1 tablet (10 mg total) by mouth 2 (two) times daily as needed for muscle spasms.   20 tablet   0   . ibuprofen (ADVIL,MOTRIN) 800 MG tablet   Oral   Take 1 tablet (800 mg total) by mouth 3 (three) times daily.   21 tablet   0   . methocarbamol (ROBAXIN) 500 MG tablet   Oral   Take 2 tablets (1,000 mg total) by mouth 4 (four) times daily as needed (Pain).   20 tablet   0   . phenazopyridine (PYRIDIUM) 100 MG tablet   Oral   Take 1 tablet (100 mg total) by mouth 3 (three) times daily as needed for pain (May take 1-2 as needed three times daily).   20 tablet   0   . sulfamethoxazole-trimethoprim (BACTRIM DS,SEPTRA DS) 800-160 MG tablet    Oral   Take 1 tablet by mouth 2 (two) times daily.   14 tablet   0     Allergies Review of patient's allergies indicates no known allergies.  Family History  Problem Relation Age of Onset  . Diabetes Mother     deceased from DM complication    Social History Social History  Substance Use Topics  . Smoking status: Never Smoker   . Smokeless tobacco: Never Used  . Alcohol Use: No     Review of Systems  Constitutional: No fever/chills Cardiovascular: No chest pain. Respiratory:  No shortness of breath. No wheezing.  Gastrointestinal: Positive lower abdominal pain.  No nausea, vomiting.  No diarrhea,constipation. Genitourinary: Positive for dysuria, increased frequency, urgency, hesitancy. No hematuria.  Musculoskeletal: Positive for back pain.  Skin: Negative for rash, skin lesions. Neurological: Negative for headaches, focal weakness or numbness. 10-point ROS otherwise negative.  ____________________________________________   PHYSICAL EXAM:  VITAL SIGNS: ED Triage Vitals  Enc Vitals Group     BP 12/08/15 2232 135/85 mmHg     Pulse Rate 12/08/15 2232 93     Resp 12/08/15 2232 18     Temp 12/08/15 2232 98.3 F (36.8 C)     Temp Source 12/08/15 2232 Oral     SpO2 12/08/15 2232 97 %     Weight 12/08/15 2232 221 lb (100.245 kg)  Height 12/08/15 2232  (1.651 m)     Head Cir --      Peak Flow --      Pain Score 12/08/15 2233 0     Pain Loc --      Pain Edu? --      Excl. in GC? --     Constitutional: Alert and oriented. Well appearing and in no acute distress. Eyes: Conjunctivae are normal.  Head: Atraumatic. Cardiovascular: Normal rate, regular rhythm. Normal S1 and S2.  Good peripheral circulation. Respiratory: Normal respiratory effort without tachypnea or retractions. Lungs CTAB. Gastrointestinal: Suprapubic tenderness with deep palpation causing the sensation to urinate. Soft and non-tender in all other quadrants. No distention, guarding. No CVA  tenderness. Musculoskeletal: No lower extremity tenderness nor edema.  No joint effusions. Neurologic:  Normal speech and language. No gross focal neurologic deficits are appreciated.  Skin:  Skin is warm, dry and intact. No rash noted. Psychiatric: Mood and affect are normal. Speech and behavior are normal. Patient exhibits appropriate insight and judgement.   ____________________________________________   LABS (all labs ordered are listed, but only abnormal results are displayed)  Labs Reviewed  URINALYSIS COMPLETEWITH MICROSCOPIC ([facility] ONLY) - Abnormal; Notable for the following:    Color, Urine YELLOW (*)    APPearance CLEAR (*)    Leukocytes, UA TRACE (*)    Squamous Epithelial / LPF 0-5 (*)    All other components within normal limits  URINE CULTURE  PREGNANCY, URINE   ____________________________________________  EKG  None ____________________________________________  RADIOLOGY  None  ____________________________________________    PROCEDURES  Procedure(s) performed: None    Medications - No data to display   ____________________________________________   INITIAL IMPRESSION / ASSESSMENT AND PLAN / ED COURSE  Pertinent lab results that were available during my care of the patient were reviewed by me and considered in my medical decision making (see chart for details).  At the time of discharge, urine culture was still pending.   Patient's diagnosis is consistent with lower urinary tract infection. Patient will be discharged home with prescriptions for Bactrim DS and Pyridium to take as directed. Patient was given one dose of Bactrim DS and Pyridium  prior to discharge in the emergency department. Patient is to follow up with her PCP or [facility] if symptoms persist past this treatment course. Patient is given ED precautions to return to the ED for any worsening or new symptoms.    ____________________________________________  FINAL  CLINICAL IMPRESSION(S) / ED DIAGNOSES  Final diagnoses:  Lower urinary tract infection, acute      NEW MEDICATIONS STARTED DURING THIS VISIT:  New Prescriptions   PHENAZOPYRIDINE (PYRIDIUM) 100 MG TABLET    Take 1 tablet (100 mg total) by mouth 3 (three) times daily as needed for pain (May take 1-2 as needed three times daily).   SULFAMETHOXAZOLE-TRIMETHOPRIM (BACTRIM DS,SEPTRA DS) 800-160 MG TABLET    Take 1 tablet by mouth 2 (two) times daily.      '   Hope Pigeon, PA-C 12/08/15 2324   Patient requested to speak to me in regards to discharge instructions. All questions were answered. She also asked for ibuprofen as she did not want to wait for other medications to come down from the pharmacy. Order placed for administration of 1 tablet Ibuprofen .   Hope Pigeon, PA-C 12/08/15 2330  Rockne Menghini, MD 12/13/15 1534

## 2015-12-08 NOTE — Discharge Instructions (Signed)

## 2015-12-10 LAB — URINE CULTURE

## 2015-12-13 ENCOUNTER — Telehealth: Payer: Self-pay | Admitting: Family

## 2015-12-13 NOTE — Telephone Encounter (Signed)
Pt is requesting a call back to discuss lab results.   CB : [phone]

## 2015-12-13 NOTE — Telephone Encounter (Signed)
Pt states she received an email to check her chart for lab results but was not able to log in to view results. Her only recent results are from ED encounter, dx UTI. Informed pt to continue taking atbx as prescribed and to schedule OV if symptoms do not resolve. Pt verbalized understanding of instructions.

## 2016-03-22 ENCOUNTER — Encounter (HOSPITAL_BASED_OUTPATIENT_CLINIC_OR_DEPARTMENT_OTHER): Payer: Self-pay

## 2016-03-22 ENCOUNTER — Emergency Department (HOSPITAL_BASED_OUTPATIENT_CLINIC_OR_DEPARTMENT_OTHER)
Admission: EM | Admit: 2016-03-22 | Discharge: 2016-03-23 | Disposition: A | Discharge status: Home / Self Care | Payer: No Typology Code available for payment source | Attending: Emergency Medicine | Admitting: Emergency Medicine

## 2016-03-22 DIAGNOSIS — R002 Palpitations: Secondary | ICD-10-CM | POA: Insufficient documentation

## 2016-03-22 DIAGNOSIS — F419 Anxiety disorder, unspecified: Secondary | ICD-10-CM | POA: Insufficient documentation

## 2016-03-22 DIAGNOSIS — M79601 Pain in right arm: Secondary | ICD-10-CM

## 2016-03-22 DIAGNOSIS — Z79899 Other long term (current) drug therapy: Secondary | ICD-10-CM | POA: Insufficient documentation

## 2016-03-22 HISTORY — DX: Anxiety disorder, unspecified: F41.9

## 2016-03-22 NOTE — ED Notes (Signed)
Pt had left elbow pain and "funny feeling," then started googling her symptoms and started having chest pain and "anxiety and palpitations."

## 2016-03-22 NOTE — ED Notes (Signed)
Pa  at bedside. 

## 2016-03-23 NOTE — Discharge Instructions (Signed)
Follow up with your primary care provider within 2 days regarding your visit to the Emergency Department today.  Return to the Emergency Department if you experience chest pain, shortness of breath, nausea, vomiting, dizziness, visual changes, or you pass out.  Musculoskeletal Pain Musculoskeletal pain is muscle and boney aches and pains. These pains can occur in any part of the body. Your caregiver may treat you without knowing the cause of the pain. They may treat you if blood or urine tests, X-rays, and other tests were normal.  CAUSES There is often not a definite cause or reason for these pains. These pains may be caused by a type of germ (virus). The discomfort may also come from overuse. Overuse includes working out too hard when your body is not fit. Boney aches also come from weather changes. Bone is sensitive to atmospheric pressure changes. HOME CARE INSTRUCTIONS   Ask when your test results will be ready. Make sure you get your test results.  Only take over-the-counter or prescription medicines for pain, discomfort, or fever as directed by your caregiver. If you were given medications for your condition, do not drive, operate machinery or power tools, or sign legal documents for 24 hours. Do not drink alcohol. Do not take sleeping pills or other medications that may interfere with treatment.  Continue all activities unless the activities cause more pain. When the pain lessens, slowly resume normal activities. Gradually increase the intensity and duration of the activities or exercise.  During periods of severe pain, bed rest may be helpful. Lay or sit in any position that is comfortable.  Putting ice on the injured area.  Put ice in a bag.  Place a towel between your skin and the bag.  Leave the ice on for 15 to 20 minutes, 3 to 4 times a day.  Follow up with your caregiver for continued problems and no reason can be found for the pain. If the pain becomes worse or does not go  away, it may be necessary to repeat tests or do additional testing. Your caregiver may need to look further for a possible cause. SEEK IMMEDIATE MEDICAL CARE IF:  You have pain that is getting worse and is not relieved by medications.  You develop chest pain that is associated with shortness or breath, sweating, feeling sick to your stomach (nauseous), or throw up (vomit).  Your pain becomes localized to the abdomen.  You develop any new symptoms that seem different or that concern you. MAKE SURE YOU:   Understand these instructions.  Will watch your condition.  Will get help right away if you are not doing well or get worse.   This information is not intended to replace advice given to you by your health care provider. Make sure you discuss any questions you have with your health care provider.   Document Released: 09/25/2005 Document Revised: 12/18/2011 Document Reviewed: 05/30/2013 Elsevier Interactive Patient Education [employer]2016 Elsevier Inc.

## 2016-03-23 NOTE — ED Provider Notes (Signed)
CSN: [id number]     Arrival date & time 03/22/16  2050 History   First MD Initiated Contact with Patient 03/22/16 2354     Chief Complaint  Patient presents with  . Arm Pain     (Consider location/radiation/quality/duration/timing/severity/associated sxs/prior Treatment) HPI   Patient is a 30 year old female with a history of anxiety presents the ED with right elbow pain and anxiety. Patient said she started to feel an achy deep pain in her elbow the last couple of hours and then went away. She is not taking anything for the pain she never had this elbow pain before. She used Google to find out the etiology of her arm pain and then she began to feel her heart race and a "funny feeling" in her chest. She states the funny feeling her chest is similar to episodes of anxiety she has had before in the past. Patient denies diaphoresis, dizziness, visual changes, nausea, vomiting, abdominal pain, numbness/tingling or weakness in her arm.  Past Medical History  Diagnosis Date  . Anxiety    Past Surgical History  Procedure Laterality Date  . Appendectomy    . Cesarean section    . Cesarean section  2011   Family History  Problem Relation Age of Onset  . Diabetes Mother     deceased from DM complication   Social History  Substance Use Topics  . Smoking status: Never Smoker   . Smokeless tobacco: Never Used  . Alcohol Use: No   OB History    Gravida Para Term Preterm AB TAB SAB Ectopic Multiple Living   0 0 0 0 0 0 1     Review of Systems  Eyes: Negative for visual disturbance.  Respiratory: Negative for shortness of breath.   Cardiovascular: Positive for palpitations.  Gastrointestinal: Negative for nausea, vomiting and abdominal pain.  Musculoskeletal: Positive for arthralgias. Negative for back pain and joint swelling.  Neurological: Negative for dizziness, syncope, weakness, numbness and headaches.      Allergies  Review of patient's allergies indicates no known  allergies.  Home Medications   Prior to Admission medications   Medication Sig Start Date End Date Taking? Authorizing Provider  cyclobenzaprine (FLEXERIL) 10 MG tablet Take 1 tablet (10 mg total) by mouth 2 (two) times daily as needed for muscle spasms. 03/27/15   Elson Areas, PA-C  ibuprofen (ADVIL,MOTRIN) 800 MG tablet Take 1 tablet (800 mg total) by mouth 3 (three) times daily. 03/27/15   Elson Areas, PA-C  methocarbamol (ROBAXIN) 500 MG tablet Take 2 tablets (1,000 mg total) by mouth 4 (four) times daily as needed (Pain). 05/14/14   Nicole Pisciotta, PA-C  phenazopyridine (PYRIDIUM) 100 MG tablet Take 1 tablet (100 mg total) by mouth 3 (three) times daily as needed for pain (May take 1-2 as needed three times daily). 12/08/15   Jami L Hagler, PA-C  sulfamethoxazole-trimethoprim (BACTRIM DS,SEPTRA DS) 800-160 MG tablet Take 1 tablet by mouth 2 (two) times daily. 12/08/15   Jami L Hagler, PA-C   BP 137/90 mmHg  Pulse 100  Temp(Src) 98.6 F (37 C) (Oral)  Resp 20  Ht  (1.651 m)  Wt 102.059 kg  BMI 37.44 kg/m2  SpO2 100%  LMP 02/23/2016 Physical Exam  Constitutional: She appears well-developed and well-nourished. No distress.  HENT:  Head: Normocephalic and atraumatic.  Eyes: Conjunctivae are normal.  Neck: Normal range of motion.  Cardiovascular: Normal rate, regular rhythm and normal heart sounds.  Exam reveals no  gallop and no friction rub.   No murmur heard. Pulses:      Radial pulses are 2+ on the right side, and 2+ on the left side.       Dorsalis pedis pulses are 2+ on the right side, and 2+ on the left side.  Pulmonary/Chest: Effort normal and breath sounds normal. No respiratory distress. She has no wheezes. She has no rales.  Abdominal: Soft. There is no tenderness.  Musculoskeletal: Normal range of motion. She exhibits no edema.  Examination of bilateral upper extremities revealed no edema, ecchymosis, deformity, full AROM, strength 5/5 including grip strength, no  TTP to right elbow, neurovascularly intact distally.  Neurological: She is alert. Coordination normal.  Skin: Skin is warm and dry. No rash noted. She is not diaphoretic.  Psychiatric: She has a normal mood and affect. Her behavior is normal.  Nursing note and vitals reviewed.   ED Course  Procedures (including critical care time) Labs Review Labs Reviewed - No data to display  Imaging Review No results found. I have personally reviewed and evaluated these images and lab results as part of my medical decision-making.   EKG Interpretation   Date/Time:  Thursday March 23 2016 00:47:55 EDT Ventricular Rate:  91 PR Interval:  171 QRS Duration: 82 QT Interval:  379 QTC Calculation: 466 R Axis:   22 Text Interpretation:  Sinus rhythm Nonspecific T abnormalities, anterior  leads No significant change was found Confirmed by MOLPUS  MD, Jonny RuizJOHN  [phone]([id number]) on 03/23/2016 12:58:14 AM      MDM   Final diagnoses:  Pain of right upper extremity  Anxiety   Patient with right elbow pain and anxiety. Patient did not have elbow pain while in the ED. She states the funny feeling in her chest is similar to anxiety she's had in the past. She states she just wants to ensure she is okay and there is nothing wrong with her. VSS, patient stable in no acute distress nondiaphoretic, well-appearing. EKG revealed sinus rhythm and no changes since last tracing. Do not feel that her palpitations or "funny feeling" is cardiopulmonary in nature. This is likely 2/2 her anxiety as she feels the same. Instructed the patient to follow-up with her primary care provider within 2 days. Discussed strict return precautions with the patient. Patient expressed understanding to the discharge instructions.  Case has been discussed with by Dr. Read DriversMolpus who agrees with the above plan to discharge.      Jerre SimonJessica L Focht, PA 03/23/16 0146  Paula LibraJohn Molpus, MD 03/23/16 [phone]

## 2016-03-23 NOTE — ED Notes (Signed)
Pt verbalizes understanding of d/c instructions and denies any further needs at this time. 

## 2016-04-14 ENCOUNTER — Ambulatory Visit: Payer: No Typology Code available for payment source | Admitting: Family

## 2016-04-14 ENCOUNTER — Telehealth: Payer: Self-pay

## 2016-04-14 DIAGNOSIS — Z0289 Encounter for other administrative examinations: Secondary | ICD-10-CM

## 2016-04-14 NOTE — Telephone Encounter (Signed)
[facility]  Patient arrived at [id number] for a 330 appt, and was told she couldn't be seen due to being past the 10 min late policy.  Patient was very upset, asked to speak with Manager.  RN team lead spoke with patient and then informed PCP office ( RN Team Lead) of situation to follow up with patient.  Thanks

## 2016-06-27 ENCOUNTER — Ambulatory Visit: Payer: No Typology Code available for payment source | Admitting: Family Medicine

## 2016-06-27 DIAGNOSIS — Z0289 Encounter for other administrative examinations: Secondary | ICD-10-CM

## 2016-07-21 ENCOUNTER — Encounter: Payer: Self-pay | Admitting: Family Medicine

## 2016-07-21 ENCOUNTER — Ambulatory Visit (INDEPENDENT_AMBULATORY_CARE_PROVIDER_SITE_OTHER): Payer: Self-pay | Admitting: Family Medicine

## 2016-07-21 VITALS — BP 138/84 | HR 87 | Temp 98.1°F | Wt 218.8 lb

## 2016-07-21 DIAGNOSIS — R1013 Epigastric pain: Secondary | ICD-10-CM

## 2016-07-21 LAB — COMPREHENSIVE METABOLIC PANEL
ALBUMIN: 4.7 g/dL (ref 3.5–5.2)
ALK PHOS: 59 U/L (ref 39–117)
ALT: 20 U/L (ref 0–35)
AST: 19 U/L (ref 0–37)
BUN: 11 mg/dL (ref 6–23)
CO2: 30 mEq/L (ref 19–32)
CREATININE: 0.87 mg/dL (ref 0.40–1.20)
Calcium: 9.7 mg/dL (ref 8.4–10.5)
Chloride: 101 mEq/L (ref 96–112)
GFR: 97.71 mL/min (ref >60.00)
GLUCOSE: 93 mg/dL (ref 70–99)
Potassium: 3.8 mEq/L (ref 3.5–5.1)
SODIUM: 138 meq/L (ref 135–145)
TOTAL PROTEIN: 7.9 g/dL (ref 6.0–8.3)
Total Bilirubin: 0.5 mg/dL (ref 0.2–1.2)

## 2016-07-21 LAB — LIPASE: LIPASE: 18 U/L (ref 11.0–59.0)

## 2016-07-21 LAB — POCT URINE PREGNANCY: PREG TEST UR: NEGATIVE

## 2016-07-21 MED ORDER — OMEPRAZOLE 20 MG PO CPDR: 20.0000 mg | DELAYED_RELEASE_CAPSULE | Freq: Every day | ORAL | 3 refills | Status: DC

## 2016-07-21 NOTE — Assessment & Plan Note (Addendum)
Given history I suspect gastritis as cause though could be PUD. Doubt liver pathology or pancreatic pathology though we'll check a CMP and lipase. Negative urine pregnancy test today. I advised her against ibuprofen and BC powder use. We will start her on Prilosec. She's given return precautions.

## 2016-07-21 NOTE — Patient Instructions (Addendum)
Nice to meet you. You likely have gastritis. We will start her on Prilosec. We'll check lab work to rule out other causes. If you develop worsening abdominal discomfort, blood in your stool, fevers, vomiting, or any new or changing symptoms please seek medical attention medially.

## 2016-07-21 NOTE — Progress Notes (Signed)
  Tommi Rumps, MD Phone: [phone]  Amy Francis is a 30 y.o. female who presents today for same-day visit.  Patient notes over the last month she's had intermittent epigastric discomfort. Typically in the morning. Feels as though she's hungry. Notes some epigastric abdominal pain with this as well. Notes a burning reflux sensation though no sour taste. Some loose stools over this time. No vomiting or nausea. Does have some decreased appetite with it. No dysuria or vaginal discharge. No fevers. LMP was yesterday. No history of this in the past. She does take a fair amount of ibuprofen and BC powders for aches and pains.  PMH: nonsmoker.   ROS see history of present illness  Objective  Physical Exam Vitals:   07/21/16 1406  BP: 138/84  Pulse: 87  Temp: 98.1 F (36.7 C)    BP Readings from Last 3 Encounters:  07/21/16 138/84  03/23/16 131/79  12/09/15 124/78   Wt Readings from Last 3 Encounters:  07/21/16 218 lb 12.8 oz (99.2 kg)  03/22/16 225 lb (102.1 kg)  12/08/15 221 lb (100.2 kg)    Physical Exam  Constitutional: She is well-developed, well-nourished, and in no distress.  HENT:  Head: Normocephalic and atraumatic.  Cardiovascular: Normal rate and regular rhythm.   Pulmonary/Chest: Effort normal and breath sounds normal.  Abdominal: Soft. Bowel sounds are normal. She exhibits no distension and no mass. There is tenderness (minimal epigastric tenderness). There is no rebound and no guarding.  Musculoskeletal: She exhibits no edema.  Neurological: She is alert. Gait normal.  Skin: Skin is warm and dry.     Assessment/Plan: Please see individual problem list.  Abdominal pain, epigastric Given history I suspect gastritis as cause though could be PUD. Doubt liver pathology or pancreatic pathology though we'll check a CMP and lipase. Negative urine pregnancy test today. I advised her against ibuprofen and BC powder use. We will start her on Prilosec. She's given  return precautions.   Orders Placed This Encounter  Procedures  . Comp Met (CMET)  . Lipase  . POCT urine pregnancy    Meds ordered this encounter  Medications  . omeprazole (PRILOSEC) 20 MG capsule    Sig: Take 1 capsule (20 mg total) by mouth daily.    Dispense:  30 capsule    Refill:  [place], MD [place]

## 2016-07-24 ENCOUNTER — Telehealth: Payer: Self-pay | Admitting: Family

## 2016-07-24 NOTE — Telephone Encounter (Signed)
Notified patient of lab results 

## 2016-07-24 NOTE — Telephone Encounter (Signed)
Pt called requesting lab results. Thank you!  Call pt @ [phone]

## 2016-08-02 ENCOUNTER — Telehealth: Payer: Self-pay | Admitting: Family

## 2016-08-02 NOTE — Telephone Encounter (Signed)
Pt to call back with full insurance info. No copy of [employer] card in chart. Please get ID#, group#, and complete name and address for medical claims including payor id#

## 2016-10-06 ENCOUNTER — Ambulatory Visit (INDEPENDENT_AMBULATORY_CARE_PROVIDER_SITE_OTHER): Payer: Managed Care, Other (non HMO) | Admitting: Physician Assistant

## 2016-10-06 ENCOUNTER — Ambulatory Visit: Payer: Self-pay

## 2016-10-06 VITALS — BP 124/70 | HR 84 | Temp 98.4°F | Resp 16 | Ht 65.0 in | Wt 227.0 lb

## 2016-10-06 DIAGNOSIS — L03012 Cellulitis of left finger: Secondary | ICD-10-CM

## 2016-10-06 MED ORDER — KETOROLAC TROMETHAMINE 60 MG/2ML IM SOLN
60.0000 mg | Freq: Once | INTRAMUSCULAR | Status: AC
  Administered 2016-10-06: 60 mg via INTRAMUSCULAR

## 2016-10-06 MED ORDER — CEPHALEXIN 500 MG PO CAPS: 500.0000 mg | ORAL_CAPSULE | Freq: Three times a day (TID) | ORAL | 0 refills | Status: DC

## 2016-10-06 NOTE — Patient Instructions (Signed)
     IF you received an x-ray today, you will receive an invoice from [facility]. Please contact [facility] at [phone] with questions or concerns regarding your invoice.   IF you received labwork today, you will receive an invoice from [facility]. Please contact [facility] at [phone] with questions or concerns regarding your invoice.   Our billing staff will not be able to assist you with questions regarding bills from these companies.  You will be contacted with the lab results as soon as they are available. The fastest way to get your results is to activate your My Chart account. Instructions are located on the last page of this paperwork. If you have not heard from us regarding the results in 2 weeks, please contact this office.     

## 2016-10-07 NOTE — Progress Notes (Signed)
   10/07/2016 4:44 PM   DOB: 01/19/1986 / MRN: [id number]  SUBJECTIVE:  Amy Francis is a 30 y.o. female presenting for painful swelling of the distal left ring finger. Reports this started about 4 days ago and today has been the most painful.  She has tried "to pop it" with a safety pin but was unable to due to pain.     She has No Known Allergies.    Review of Systems  Constitutional: Negative for fever.  Gastrointestinal: Negative for nausea.  Neurological: Negative for tingling and focal weakness.    The problem list and medications were reviewed and updated by myself where necessary and exist elsewhere in the encounter.   OBJECTIVE:  BP 124/70   Pulse 84   Temp 98.4 F (36.9 C) (Oral)   Resp 16   Ht 5\' 5"  (1.651 m)   Wt 227 lb (103 kg)   SpO2 100%   BMI 37.77 kg/m   Physical Exam  Cardiovascular: Normal rate.   Musculoskeletal: Normal range of motion. She exhibits tenderness.       Hands: Skin: She is not diaphoretic.   Risk and benefits discussed and verbal consent obtained. Anesthetic allergies reviewed. Patient anesthetized using 1:1 mix of 2% lidocaine without epi. A 1 cm incision was made using a number 11 blade and purulent material was expressed.  The was not wound packed. The patient tolerated the procedure without difficulty.   A clean dressing was placed and wound care instructions were provided.       No results found for this or any previous visit (from the past 72 hour(s)).  No results found.  ASSESSMENT AND PLAN  Amy Francis was seen today for finger infection.  Diagnoses and all orders for this visit:  Paronychia of left ring finger: Advised warm compress. ABX if not improving in 24 hours.  Of note the procedure was particularly painful for her despite adequate anesthesia. She received a shot of toradol for this reason.  Ibuprofen 600-800 mg q8 as needed at home.  -     cephALEXin (KEFLEX) 500 MG capsule; Take 1 capsule (500 mg total) by mouth 3  (three) times daily. Fill only if your finger is not improving in 24 hours, you have fever, chills, or redness at the site of the finger. -     ketorolac (TORADOL) injection 60 mg; Inject 2 mLs (60 mg total) into the muscle once.    The patient is advised to call or return to clinic if she does not see an improvement in symptoms, or to seek the care of the closest emergency department if she worsens with the above plan.   Deliah BostonMichael Clark, MHS, PA-C [facility] 10/07/2016 4:44 PM

## 2016-11-01 ENCOUNTER — Other Ambulatory Visit: Payer: Self-pay | Admitting: Obstetrics and Gynecology

## 2016-11-07 ENCOUNTER — Encounter: Payer: Self-pay | Admitting: Family

## 2016-12-04 ENCOUNTER — Ambulatory Visit: Payer: No Typology Code available for payment source | Admitting: Family

## 2016-12-04 ENCOUNTER — Telehealth: Payer: Self-pay | Admitting: Family

## 2016-12-04 ENCOUNTER — Encounter: Payer: Self-pay | Admitting: Family

## 2016-12-04 DIAGNOSIS — Z0289 Encounter for other administrative examinations: Secondary | ICD-10-CM

## 2016-12-04 NOTE — Telephone Encounter (Signed)
FYI, Pt called stating she can not make her appt today due to sch conflict. Appt is still on the sch. Thank you!

## 2016-12-04 NOTE — Telephone Encounter (Signed)
FYI

## 2017-01-03 ENCOUNTER — Ambulatory Visit (INDEPENDENT_AMBULATORY_CARE_PROVIDER_SITE_OTHER): Payer: Managed Care, Other (non HMO) | Admitting: Family Medicine

## 2017-01-03 VITALS — BP 122/77 | HR 115 | Temp 98.6°F | Resp 17 | Ht 65.5 in | Wt 217.0 lb

## 2017-01-03 DIAGNOSIS — Z0000 Encounter for general adult medical examination without abnormal findings: Secondary | ICD-10-CM

## 2017-01-03 DIAGNOSIS — Z833 Family history of diabetes mellitus: Secondary | ICD-10-CM

## 2017-01-03 LAB — POCT GLYCOSYLATED HEMOGLOBIN (HGB A1C): Hemoglobin A1C: 5.8

## 2017-01-03 NOTE — Progress Notes (Signed)
   Amy Francis is a 31 y.o. female who presents to [facility] today for comprehensive physical examination:  CPE -- patient here for annual CPE.  Followed by [facility], yet she does not want to always have to drive there.  Interested in becoming patient here.    Concerns:  Worried about cholesterol and blood sugar.  Has family history of diabetes in her mother.    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 Last Pap was about a year ago -- followed by outside OB/GYN   PMH reviewed. Patient is a nonsmoker.   Past Medical History:  Diagnosis Date  . Anxiety    Past Surgical History:  Procedure Laterality Date  . APPENDECTOMY    . CESAREAN SECTION    . CESAREAN SECTION  2011    Medications reviewed. No current outpatient prescriptions on file.   No current facility-administered medications for this visit.     Social: Smoking history:  denies Alcohol use:  Rarely/socially Illicit drug use:  denies Relationship status:   single Occupation:  Education  Family History:  Diabetes in mother.  And maternal grandmother.  Denies any HTN or HLD in family   Review of Systems  Constitutional: Negative for fever.  HENT: Negative for congestion, ear discharge, ear pain and hearing loss.   Eyes: Negative for blurred vision.  Respiratory: Negative for cough and wheezing.   Cardiovascular: Negative for chest pain, palpitations and leg swelling.  Gastrointestinal: Negative for nausea, vomiting and abdominal pain.  Genitourinary: Negative for dysuria, hematuria and flank pain.  Musculoskeletal: Negative for neck pain.  Skin: Negative for rash.  Neurological: Negative for dizziness and headaches.  Psychiatric/Behavioral: Negative for depression and suicidal ideas.   Exam: BP 122/77   Pulse (!) 115   Temp 98.6 F (37 C) (Oral)   Resp 17   Ht 5'  5.5" (1.664 m)   Wt 217 lb (98.4 kg)   LMP 12/13/2016 (Approximate)   SpO2 97%   BMI 35.56 kg/m  Gen:  Alert, cooperative patient who appears stated age in no acute distress.  Vital signs reviewed. Head: [place]/AT.   Eyes:  EOMI, PERRL.   Ears:  External ears WNL, Bilateral TM's normal without retraction, redness or bulging. Nose:  Septum midline  Mouth:  MMM, tonsils non-erythematous, non-edematous.   Neck: No masses or thyromegaly or limitation in range of motion.  No cervical lymphadenopathy. Pulm:  Clear to auscultation bilaterally with good air movement.  No wheezes or rales noted.   Cardiac:  Regular rate and rhythm without murmur auscultated.  Good S1/S2. Abd:  Soft/nondistended/nontender.  Good bowel sounds throughout all four quadrants.  No masses noted.  Ext:  No clubbing/cyanosis/erythema.  No edema noted bilateral lower extremities.   Neuro:  Grossly normal, no gait abnormalities Psych:  Not depressed or anxious appearing.  Conversant and engaged    Impression/Plan: 1. Complete Physical Examination: anticipatory guidance provided.  2.  STD screening/high risk sexual behavior: declined.  States she's followed by OB/GYN 3.  Vaccines:  Up to date (patient reported) 4.  Screening cholesterol: obtain FLP. - checking A1C today -- 5.8.  FU for yearly physical or sooner if she has concerns.

## 2017-01-03 NOTE — Patient Instructions (Addendum)
It was very good to meet you today.  Things look like they are going well.  Your blood sugar test (A1C) was 5.8.   The other lab tests will return in a day or so.  We will let you know the results.     IF you received an x-ray today, you will receive an invoice from [facility]. Please contact [facility] at [phone] with questions or concerns regarding your invoice.   IF you received labwork today, you will receive an invoice from [facility]. Please contact [facility] at [phone] with questions or concerns regarding your invoice.   Our billing staff will not be able to assist you with questions regarding bills from these companies.  You will be contacted with the lab results as soon as they are available. The fastest way to get your results is to activate your My Chart account. Instructions are located on the last page of this paperwork. If you have not heard from us regarding the results in 2 weeks, please contact this office.

## 2017-01-04 LAB — COMPREHENSIVE METABOLIC PANEL
ALK PHOS: 44 IU/L (ref 39–117)
ALT: 29 IU/L (ref 0–32)
AST: 26 IU/L (ref 0–40)
Albumin/Globulin Ratio: 1.4 (ref 1.2–2.2)
Albumin: 4.3 g/dL (ref 3.5–5.5)
BILIRUBIN TOTAL: 0.5 mg/dL (ref 0.0–1.2)
BUN/Creatinine Ratio: 12 (ref 9–23)
BUN: 10 mg/dL (ref 6–20)
CHLORIDE: 101 mmol/L (ref 96–106)
CO2: 23 mmol/L (ref 18–29)
CREATININE: 0.81 mg/dL (ref 0.57–1.00)
Calcium: 9.3 mg/dL (ref 8.7–10.2)
GFR calc Af Amer: 112 mL/min/{1.73_m2} (ref >59)
GFR calc non Af Amer: 97 mL/min/{1.73_m2} (ref >59)
GLOBULIN, TOTAL: 3 g/dL (ref 1.5–4.5)
GLUCOSE: 97 mg/dL (ref 65–99)
Potassium: 4.4 mmol/L (ref 3.5–5.2)
SODIUM: 139 mmol/L (ref 134–144)
Total Protein: 7.3 g/dL (ref 6.0–8.5)

## 2017-01-04 LAB — LIPID PANEL
Chol/HDL Ratio: 2.2 ratio units (ref 0.0–4.4)
Cholesterol, Total: 130 mg/dL (ref 100–199)
HDL: 60 mg/dL (ref >39)
LDL Calculated: 45 mg/dL (ref 0–99)
TRIGLYCERIDES: 124 mg/dL (ref 0–149)
VLDL Cholesterol Cal: 25 mg/dL (ref 5–40)

## 2017-01-04 LAB — CBC
Hematocrit: 41.2 % (ref 34.0–46.6)
Hemoglobin: 14 g/dL (ref 11.1–15.9)
MCH: 30.6 pg (ref 26.6–33.0)
MCHC: 34 g/dL (ref 31.5–35.7)
MCV: 90 fL (ref 79–97)
PLATELETS: 319 10*3/uL (ref 150–379)
RBC: 4.57 x10E6/uL (ref 3.77–5.28)
RDW: 13.1 % (ref 12.3–15.4)
WBC: 7.4 10*3/uL (ref 3.4–10.8)

## 2017-04-10 ENCOUNTER — Encounter: Payer: Self-pay | Admitting: Family

## 2017-04-10 ENCOUNTER — Ambulatory Visit (INDEPENDENT_AMBULATORY_CARE_PROVIDER_SITE_OTHER): Payer: Self-pay | Admitting: Family

## 2017-04-10 VITALS — BP 127/71 | HR 95 | Temp 98.6°F | Resp 16 | Ht 65.0 in | Wt 217.8 lb

## 2017-04-10 DIAGNOSIS — M5432 Sciatica, left side: Secondary | ICD-10-CM

## 2017-04-10 MED ORDER — MELOXICAM 7.5 MG PO TABS: 7.5000 mg | ORAL_TABLET | Freq: Every day | ORAL | 0 refills | Status: DC

## 2017-04-10 NOTE — Progress Notes (Signed)
Subjective:    Patient ID: Amy Francis, female    DOB: 10/17/1985, 31 y.o.   MRN: [id number]  HPI  Ms.  Manson Francis is a 31 yr old female who presents today with chief complaint of low back pain.  Pain radiates down the left buttock and down th back of her leg.  Worsened on Saturday.  Was very painful yesterday, more mild today. Denies associated weakness or numbness.     Review of Systems    see HPI  Past Medical History:  Diagnosis Date  . Anxiety      Social History   Social History  . Marital status: Single    Spouse name: N/A  . Number of children: N/A  . Years of education: N/A   Occupational History  . Not on file.   Social History Main Topics  . Smoking status: Never Smoker  . Smokeless tobacco: Never Used  . Alcohol use No  . Drug use: No  . Sexual activity: Yes    Birth control/ protection: Condom   Other Topics Concern  . Not on file   Social History Narrative   Parent educator   Daughter- [name] born 2011   Completed bachelors degree   Single   She has a good support system          Past Surgical History:  Procedure Laterality Date  . APPENDECTOMY    . CESAREAN SECTION    . CESAREAN SECTION  2011    Family History  Problem Relation Age of Onset  . Diabetes Mother        deceased from DM complication    No Known Allergies  No current outpatient prescriptions on file prior to visit.   No current facility-administered medications on file prior to visit.     BP 127/71 (BP Location: Left Arm, Cuff Size: Normal)   Pulse 95   Temp 98.6 F (37 C) (Oral)   Resp 16   Ht 5\' 5"  (1.651 m)   Wt 217 lb 12.8 oz (98.8 kg)   LMP 03/20/2017   SpO2 100%   BMI 36.24 kg/m    Objective:   Physical Exam  Constitutional: She is oriented to person, place, and time. She appears well-developed and well-nourished.  HENT:  Head: Normocephalic and atraumatic.  Cardiovascular: Normal rate, regular rhythm and normal heart sounds.   No murmur  heard. Pulmonary/Chest: Effort normal and breath sounds normal. No respiratory distress. She has no wheezes.  Musculoskeletal:       Cervical back: She exhibits no tenderness.       Thoracic back: She exhibits no tenderness.       Lumbar back: She exhibits no tenderness.  Neurological: She is alert and oriented to person, place, and time.  Reflex Scores:      Patellar reflexes are 2+ on the right side and 2+ on the left side. Bilateral LE strength is 5/5  Psychiatric: She has a normal mood and affect. Her behavior is normal. Judgment and thought content normal.          Assessment & Plan:  Sciatica- Symptoms most consistent with a left sided sciatica. Will prescribe meloxicam once daily. She is advised that she may also use Tylenol if needed for severe pain. We discussed red flag symptoms such as bowel or bladder incontinence or new leg weakness. She understands to go to the ER if this occurs. In the meantime I've advised her likely know if symptoms worsen or if they  fail to improve.

## 2017-04-10 NOTE — Patient Instructions (Addendum)
Please begin meloxicam once daily for low back pain/sciatica. Call if new/worsening symptoms or if symptoms are not improved in 1-2 weeks.    Sciatica Sciatica is pain, numbness, weakness, or tingling along the path of the sciatic nerve. The sciatic nerve starts in the lower back and runs down the back of each leg. The nerve controls the muscles in the lower leg and in the back of the knee. It also provides feeling (sensation) to the back of the thigh, the lower leg, and the sole of the foot. Sciatica is a symptom of another medical condition that pinches or puts pressure on the sciatic nerve. Generally, sciatica only affects one side of the body. Sciatica usually goes away on its own or with treatment. In some cases, sciatica may keep coming back (recur). What are the causes? This condition is caused by pressure on the sciatic nerve, or pinching of the sciatic nerve. This may be the result of:  A disk in between the bones of the spine (vertebrae) bulging out too far (herniated disk).  Age-related changes in the spinal disks (degenerative disk disease).  A pain disorder that affects a muscle in the buttock (piriformis syndrome).  Extra bone growth (bone spur) near the sciatic nerve.  An injury or break (fracture) of the pelvis.  Pregnancy.  Tumor (rare).  What increases the risk? The following factors may make you more likely to develop this condition:  Playing sports that place pressure or stress on the spine, such as football or weight lifting.  Having poor strength and flexibility.  A history of back injury.  A history of back surgery.  Sitting for long periods of time.  Doing activities that involve repetitive bending or lifting.  Obesity.  What are the signs or symptoms? Symptoms can vary from mild to very severe, and they may include:  Any of these problems in the lower back, leg, hip, or buttock: ? Mild tingling or dull aches. ? Burning sensations. ? Sharp  pains.  Numbness in the back of the calf or the sole of the foot.  Leg weakness.  Severe back pain that makes movement difficult.  These symptoms may get worse when you cough, sneeze, or laugh, or when you sit or stand for long periods of time. Being overweight may also make symptoms worse. In some cases, symptoms may recur over time. How is this diagnosed? This condition may be diagnosed based on:  Your symptoms.  A physical exam. Your health care provider may ask you to do certain movements to check whether those movements trigger your symptoms.  You may have tests, including: ? Blood tests. ? X-rays. ? MRI. ? CT scan.  How is this treated? In many cases, this condition improves on its own, without any treatment. However, treatment may include:  Reducing or modifying physical activity during periods of pain.  Exercising and stretching to strengthen your abdomen and improve the flexibility of your spine.  Icing and applying heat to the affected area.  Medicines that help: ? To relieve pain and swelling. ? To relax your muscles.  Injections of medicines that help to relieve pain, irritation, and inflammation around the sciatic nerve (steroids).  Surgery.  Follow these instructions at home: Medicines  Take over-the-counter and prescription medicines only as told by your health care provider.  Do not drive or operate heavy machinery while taking prescription pain medicine. Managing pain  If directed, apply ice to the affected area. ? Put ice in a plastic bag. ?  Place a towel between your skin and the bag. ? Leave the ice on for 20 minutes, 2-3 times a day.  After icing, apply heat to the affected area before you exercise or as often as told by your health care provider. Use the heat source that your health care provider recommends, such as a moist heat pack or a heating pad. ? Place a towel between your skin and the heat source. ? Leave the heat on for 20-30  minutes. ? Remove the heat if your skin turns bright red. This is especially important if you are unable to feel pain, heat, or cold. You may have a greater risk of getting burned. Activity  Return to your normal activities as told by your health care provider. Ask your health care provider what activities are safe for you. ? Avoid activities that make your symptoms worse.  Take brief periods of rest throughout the day. Resting in a lying or standing position is usually better than sitting to rest. ? When you rest for longer periods, mix in some mild activity or stretching between periods of rest. This will help to prevent stiffness and pain. ? Avoid sitting for long periods of time without moving. Get up and move around at least one time each hour.  Exercise and stretch regularly, as told by your health care provider.  Do not lift anything that is heavier than 10 lb (4.5 kg) while you have symptoms of sciatica. When you do not have symptoms, you should still avoid heavy lifting, especially repetitive heavy lifting.  When you lift objects, always use proper lifting technique, which includes: ? Bending your knees. ? Keeping the load close to your body. ? Avoiding twisting. General instructions  Use good posture. ? Avoid leaning forward while sitting. ? Avoid hunching over while standing.  Maintain a healthy weight. Excess weight puts extra stress on your back and makes it difficult to maintain good posture.  Wear supportive, comfortable shoes. Avoid wearing high heels.  Avoid sleeping on a mattress that is too soft or too hard. A mattress that is firm enough to support your back when you sleep may help to reduce your pain.  Keep all follow-up visits as told by your health care provider. This is important. Contact a health care provider if:  You have pain that wakes you up when you are sleeping.  You have pain that gets worse when you lie down.  Your pain is worse than you have  experienced in the past.  Your pain lasts longer than 4 weeks.  You experience unexplained weight loss. Get help right away if:  You lose control of your bowel or bladder (incontinence).  You have: ? Weakness in your lower back, pelvis, buttocks, or legs that gets worse. ? Redness or swelling of your back. ? A burning sensation when you urinate. This information is not intended to replace advice given to you by your health care provider. Make sure you discuss any questions you have with your health care provider. Document Released: 09/19/2001 Document Revised: 02/29/2016 Document Reviewed: 06/04/2015 Elsevier Interactive Patient Education  2017 [employer].

## 2017-05-11 LAB — HM PAP SMEAR

## 2017-06-01 ENCOUNTER — Encounter: Payer: Self-pay | Admitting: Family

## 2017-06-01 ENCOUNTER — Other Ambulatory Visit: Payer: Self-pay | Admitting: Family

## 2017-06-01 MED ORDER — MELOXICAM 7.5 MG PO TABS: 7.5000 mg | ORAL_TABLET | Freq: Every day | ORAL | 0 refills | Status: DC | PRN

## 2017-06-01 NOTE — Telephone Encounter (Signed)
Rx sent 

## 2017-06-01 NOTE — Telephone Encounter (Signed)
Self   Refill for meloxicam   Pharmacy: [facility] [place]

## 2018-01-17 ENCOUNTER — Ambulatory Visit: Payer: Self-pay | Admitting: Medical

## 2018-01-17 DIAGNOSIS — Z0289 Encounter for other administrative examinations: Secondary | ICD-10-CM

## 2018-01-23 LAB — HM PAP SMEAR: HM PAP: NORMAL

## 2018-02-04 ENCOUNTER — Encounter: Payer: Self-pay | Admitting: Family

## 2018-05-31 ENCOUNTER — Ambulatory Visit: Payer: Self-pay | Admitting: Internal Medicine

## 2018-05-31 DIAGNOSIS — Z0289 Encounter for other administrative examinations: Secondary | ICD-10-CM

## 2018-06-13 ENCOUNTER — Telehealth: Payer: Self-pay

## 2018-06-13 NOTE — Telephone Encounter (Signed)
Copied from CRM [id number]. Topic: Appointment Scheduling - Scheduling Inquiry for Clinic >> Jun 13, 2018  8:42 AM Oneal Grout wrote: Reason for CRM: Requesting to be seen in [place] office today, current patient of Sandford Craze, works in [place] area. Requesting to be seen for back pain. Offered appt in [facility] declined, Requesting call back asap

## 2018-06-13 NOTE — Telephone Encounter (Signed)
Called and left voicemail for patient to see if we could get her scheduled with Leanora Cover and to give the office a call back.

## 2018-06-13 NOTE — Telephone Encounter (Signed)
Copied from CRM [id number]. Topic: General - Other >> Jun 13, 2018  8:48 AM Oneal Grout wrote: Reason for CRM: Requesting to transfer from [facility] to [employer] [facility] due to being closer to her employer and home.

## 2018-06-13 NOTE — Telephone Encounter (Signed)
Call pt back when possible

## 2018-06-14 NOTE — Telephone Encounter (Signed)
Note that patient has no showed 3 times at our location.

## 2018-06-14 NOTE — Telephone Encounter (Signed)
OK with me.

## 2018-06-14 NOTE — Telephone Encounter (Signed)
Can you assist in getting Pt scheduled w/ new PCP there? Already approved by [facility].

## 2018-07-18 ENCOUNTER — Ambulatory Visit: Payer: Self-pay | Admitting: Family Medicine

## 2018-07-18 DIAGNOSIS — Z0289 Encounter for other administrative examinations: Secondary | ICD-10-CM

## 2018-07-18 NOTE — Progress Notes (Deleted)
   Subjective:    Patient ID: Amy Francis, female    DOB: 10-08-1986, 32 y.o.   MRN: [id number]  HPI  Presents to clinic c/o low back pain for....  Hx of low back pain with left sided sciatica in 2018  Patient Active Problem List   Diagnosis Date Noted  . Abdominal pain, epigastric 07/21/2016  . Carpal tunnel syndrome 10/14/2013  . Unspecified vitamin D deficiency 07/21/2013  . Anxiety state, unspecified 07/14/2013  . Paresthesia 07/14/2013  . Atypical chest pain 07/14/2013  . Obesity (BMI 30-39.9) 07/14/2013   Social History   Tobacco Use  . Smoking status: Never Smoker  . Smokeless tobacco: Never Used  Substance Use Topics  . Alcohol use: No   Review of Systems   Constitutional: Negative for chills, fatigue and fever.  HENT: Negative for congestion, ear pain, sinus pain and sore throat.   Eyes: Negative.   Respiratory: Negative for cough, shortness of breath and wheezing.   Cardiovascular: Negative for chest pain, palpitations and leg swelling.  Gastrointestinal: Negative for abdominal pain, diarrhea, nausea and vomiting.  Genitourinary: Negative for dysuria, frequency and urgency.  Musculoskeletal: +back pain Skin: Negative for color change, pallor and rash.  Neurological: Negative for syncope, light-headedness and headaches.  Psychiatric/Behavioral: The patient is not nervous/anxious.       Objective:   Physical Exam        Assessment & Plan:

## 2018-09-03 ENCOUNTER — Encounter: Payer: Self-pay | Admitting: Family Medicine

## 2018-09-03 ENCOUNTER — Ambulatory Visit: Payer: 59 | Admitting: Family Medicine

## 2018-09-03 ENCOUNTER — Ambulatory Visit (INDEPENDENT_AMBULATORY_CARE_PROVIDER_SITE_OTHER): Payer: 59

## 2018-09-03 VITALS — BP 138/86 | HR 94 | Temp 98.7°F | Ht 65.0 in | Wt 222.4 lb

## 2018-09-03 DIAGNOSIS — M545 Low back pain: Secondary | ICD-10-CM

## 2018-09-03 DIAGNOSIS — G8929 Other chronic pain: Secondary | ICD-10-CM

## 2018-09-03 LAB — POCT URINE PREGNANCY: Preg Test, Ur: NEGATIVE

## 2018-09-03 MED ORDER — METHYLPREDNISOLONE 4 MG PO TBPK: ORAL_TABLET | ORAL | 0 refills | Status: DC

## 2018-09-03 NOTE — Progress Notes (Signed)
Subjective:    Patient ID: Amy Francis, female    DOB: 04/29/1986, 32 y.o.   MRN: [id number]  HPI  Presents to clinic c/o low back pain for months.  Patient was seen for similar issue back in July 2018 by Amy Francis PCP, she was treated with Mobic.  Patient states it seemed to help somewhat, but pain returned.  Patient denies any known injury to the back, states she will notice the pain mainly in the morning when getting up out of bed, and after sitting too long.  Most recently patient has tried some Tylenol to help pain without much effect.  Patient denies any numbness in legs, denies saddle anesthesia, denies loss of bowel or bladder control.   Patient Active Problem List   Diagnosis Date Noted  . Abdominal pain, epigastric 07/21/2016  . Carpal tunnel syndrome 10/14/2013  . Unspecified vitamin D deficiency 07/21/2013  . Anxiety state, unspecified 07/14/2013  . Paresthesia 07/14/2013  . Atypical chest pain 07/14/2013  . Obesity (BMI 30-39.9) 07/14/2013   Social History   Tobacco Use  . Smoking status: Never Smoker  . Smokeless tobacco: Never Used  Substance Use Topics  . Alcohol use: No   Review of Systems   Constitutional: Negative for chills, fatigue and fever.  HENT: Negative for congestion, ear pain, sinus pain and sore throat.   Eyes: Negative.   Respiratory: Negative for cough, shortness of breath and wheezing.   Cardiovascular: Negative for chest pain, palpitations and leg swelling.  Gastrointestinal: Negative for abdominal pain, diarrhea, nausea and vomiting.  Genitourinary: Negative for dysuria, frequency and urgency.  Musculoskeletal: +low back pain.  Skin: Negative for color change, pallor and rash.  Neurological: Negative for syncope, light-headedness and headaches.  Psychiatric/Behavioral: The patient is not nervous/anxious.       Objective:   Physical Exam  Constitutional: She is oriented to person, place, and time. No distress.  HENT:  Head: Normocephalic  and atraumatic.  Eyes: Conjunctivae and EOM are normal. No scleral icterus.  Neck: Normal range of motion. Neck supple. No tracheal deviation present.  Cardiovascular: Normal rate and regular rhythm.  Pulmonary/Chest: Effort normal and breath sounds normal. No respiratory distress.  Musculoskeletal: She exhibits no edema.       Back:  Low back tenderness represented by green circle on diagram.  Patient is able to bend forward and backward, side to side without issue. Twisting to left side seems to ache the most, but patient is able to do this. Straight leg raises do cause some pulling pain across low back.   Neurological: She is alert and oriented to person, place, and time.  Skin: Skin is warm and dry. No rash noted. No erythema. No pallor.  Psychiatric: She has a normal mood and affect. Amy Francis behavior is normal.  Nursing note and vitals reviewed.     Vitals:   09/03/18 1431  BP: 138/86  Pulse: 94  Temp: 98.7 F (37.1 C)  SpO2: 95%   Assessment & Plan:   Chronic low back pain-patient states she likes to avoid things like ibuprofen due to them upsetting Amy Francis stomach.  We will use steroid taper.  Patient also will get x-ray of lumbar spine today in clinic.  Patient given printout of exercises to do to help improve low back pain.  We will also do physical therapy referral due to chronicity of Amy Francis back pain.  Patient will follow-up with Amy Francis PCP, advised to return to clinic or PCP if current issues persist  or worsen.

## 2018-09-03 NOTE — Patient Instructions (Signed)

## 2018-09-04 ENCOUNTER — Ambulatory Visit: Payer: Self-pay | Admitting: *Deleted

## 2018-09-04 ENCOUNTER — Telehealth: Payer: Self-pay

## 2018-09-04 DIAGNOSIS — M545 Low back pain, unspecified: Secondary | ICD-10-CM

## 2018-09-04 DIAGNOSIS — G8929 Other chronic pain: Secondary | ICD-10-CM

## 2018-09-04 DIAGNOSIS — M5136 Other intervertebral disc degeneration, lumbar region: Secondary | ICD-10-CM

## 2018-09-04 NOTE — Telephone Encounter (Signed)
Please let pt know that the only lab I see from yesterday was a urine pregnancy test which is negative.

## 2018-09-04 NOTE — Telephone Encounter (Signed)
"  Pt states that the the steroid she began yesterday is not helping the pain in her lower back and she is wanting to see if something for pain".    Called patient regarding the pain in her lower back. Patient stated that she had not taken the steroids that had been prescribed yesteday, because she was afraid of taking so many tabs at a time. So had only taken about 3 tabs and put the other 3 aside to take when she got back home.  So she was still having back pain and wanted to get something else for pain. Advised patient that with the medrol dose pak, you take the tabs as written to help alleviate the pain.  And should take them until all gone. Call office back for any other concerns.  She was not happy that the provider did not explain the dose pak to her. I apologize and told her that with any medication, she should always check her medication at the pharmacy and ask the pharmacist any questions regarding how to take the medication. Pt voiced understanding.   Reason for Disposition . Caller has medication question only, adult not sick, and triager answers question  Answer Assessment - Initial Assessment Questions 1. SYMPTOMS: "Do you have any symptoms?"     Pain in lower back 2. SEVERITY: If symptoms are present, ask "Are they mild, moderate or severe?"     Moderate  Protocols used: MEDICATION QUESTION CALL-A-AH

## 2018-09-04 NOTE — Telephone Encounter (Signed)
Called and LMOM (5:07pm) informing the pt that I will give her a call back on Monday regarding her message below.//AB/CMA

## 2018-09-04 NOTE — Telephone Encounter (Signed)
Copied from CRM [id number] [id number]. Topic: General - Other >> Sep 04, 2018 12:11 PM Gean BirchwoodWilliams-Neal, Sade R wrote: Patient is calling for her lab results

## 2018-09-05 NOTE — Telephone Encounter (Signed)
Amy Francis, looks like she is following with you now. Thanks

## 2018-09-09 MED ORDER — MELOXICAM 7.5 MG PO TABS: 7.5000 mg | ORAL_TABLET | Freq: Every day | ORAL | 0 refills | Status: DC

## 2018-09-09 NOTE — Addendum Note (Signed)
Addended by: Leanora CoverGUSE, Aiyla Baucom on: 09/09/2018 09:40 AM   Modules accepted: Orders

## 2018-09-09 NOTE — Telephone Encounter (Signed)
Called Pt no answer and mailbox is full. Will try back later.

## 2018-09-09 NOTE — Telephone Encounter (Signed)
OK sounds good. The pharmacy should also alert her if a Rx is ready

## 2018-09-09 NOTE — Telephone Encounter (Signed)
[place]. Please call when available.

## 2018-09-09 NOTE — Telephone Encounter (Signed)
Rx for mobic sent in, take this with food  Also she should be hearing at some time this week in regard to getting PT referral set up

## 2018-09-13 NOTE — Telephone Encounter (Signed)
Called Pt today no answer, and VM was full. Will try back later

## 2018-09-17 NOTE — Telephone Encounter (Signed)
Called Pt about Rx, Pt stated yes she picked up her Rx

## 2018-10-03 ENCOUNTER — Ambulatory Visit (HOSPITAL_COMMUNITY)
Admission: EM | Admit: 2018-10-03 | Discharge: 2018-10-03 | Disposition: A | Discharge status: Home / Self Care | Payer: 59 | Attending: Family Medicine | Admitting: Family Medicine

## 2018-10-03 ENCOUNTER — Encounter (HOSPITAL_COMMUNITY): Payer: Self-pay | Admitting: Emergency Medicine

## 2018-10-03 DIAGNOSIS — J020 Streptococcal pharyngitis: Secondary | ICD-10-CM | POA: Insufficient documentation

## 2018-10-03 LAB — POCT RAPID STREP A: Streptococcus, Group A Screen (Direct): POSITIVE — AB

## 2018-10-03 MED ORDER — AMOXICILLIN 875 MG PO TABS: 875.0000 mg | ORAL_TABLET | Freq: Two times a day (BID) | ORAL | 0 refills | Status: DC

## 2018-10-03 NOTE — ED Triage Notes (Signed)
[facility] assessment of sore throat x 2 days, unknown fevers at home.  OTC meds tried include ibuprofen and tylenol.

## 2018-10-03 NOTE — ED Provider Notes (Signed)
[facility]    CSN: [id number] Arrival date & time: 10/03/18  1100     History   Chief Complaint Chief Complaint  Patient presents with  . Sore Throat    HPI Amy Francis is a 32 y.o. female.   Initial visit for this 32 year old woman.  [facility] of sore throat x 2 days, unknown fevers at home.  OTC meds tried include ibuprofen and tylenol.    She was exposed to her cousin 2 days ago who reportedly had the flu.     Past Medical History:  Diagnosis Date  . Anxiety     Patient Active Problem List   Diagnosis Date Noted  . Abdominal pain, epigastric 07/21/2016  . Carpal tunnel syndrome 10/14/2013  . Unspecified vitamin D deficiency 07/21/2013  . Anxiety state, unspecified 07/14/2013  . Paresthesia 07/14/2013  . Atypical chest pain 07/14/2013  . Obesity (BMI 30-39.9) 07/14/2013    Past Surgical History:  Procedure Laterality Date  . APPENDECTOMY    . CESAREAN SECTION    . CESAREAN SECTION  2011    OB History    Gravida  1   Para  1   Term  1   Preterm  0   AB  0   Living  1     SAB  0   TAB  0   Ectopic  0   Multiple  0   Live Births               Home Medications    Prior to Admission medications   Medication Sig Start Date End Date Taking? Authorizing Provider  amoxicillin (AMOXIL) 875 MG tablet Take 1 tablet (875 mg total) by mouth 2 (two) times daily. 10/03/18   Elvina SidleLauenstein, Giomar Gusler, MD    Family History Family History  Problem Relation Age of Onset  . Diabetes Mother        deceased from DM complication    Social History Social History   Tobacco Use  . Smoking status: Never Smoker  . Smokeless tobacco: Never Used  Substance Use Topics  . Alcohol use: No  . Drug use: No     Allergies   Patient has no known allergies.   Review of Systems Review of Systems   Physical Exam Triage Vital Signs ED Triage Vitals [10/03/18 1227]  Enc Vitals Group     BP (!) 121/107     Pulse  Rate (!) 110     Resp 16     Temp 98.7 F (37.1 C)     Temp Source Oral     SpO2 100 %     Weight      Height      Head Circumference      Peak Flow      Pain Score 8     Pain Loc      Pain Edu?      Excl. in GC?    No data found.  Updated Vital Signs BP (!) 146/84 (BP Location: Left Arm) Comment (BP Location): large cuff  Pulse (!) 110   Temp 98.7 F (37.1 C) (Oral)   Resp 16   SpO2 100%    Physical Exam Vitals signs and nursing note reviewed.  Constitutional:      Appearance: She is well-developed. She is obese.  HENT:     Head: Normocephalic.     Right Ear: Tympanic membrane and ear canal normal.  Left Ear: Tympanic membrane and ear canal normal.     Nose: Congestion present.     Mouth/Throat:     Mouth: Mucous membranes are moist.     Pharynx: Posterior oropharyngeal erythema present.  Eyes:     Conjunctiva/sclera: Conjunctivae normal.  Neck:     Musculoskeletal: Normal range of motion and neck supple.  Pulmonary:     Effort: Pulmonary effort is normal.  Skin:    General: Skin is warm and dry.  Neurological:     General: No focal deficit present.     Mental Status: She is alert.  Psychiatric:        Mood and Affect: Mood normal.      UC Treatments / Results  Labs (all labs ordered are listed, but only abnormal results are displayed) Labs Reviewed  POCT RAPID STREP A - Abnormal; Notable for the following components:      Result Value   Streptococcus, Group A Screen (Direct) POSITIVE (*)    All other components within normal limits    EKG None  Radiology No results found.  Procedures Procedures (including critical care time)  Medications Ordered in UC Medications - No data to display  Initial Impression / Assessment and Plan / UC Course  I have reviewed the triage vital signs and the nursing notes.  Pertinent labs & imaging results that were available during my care of the patient were reviewed by me and considered in my medical  decision making (see chart for details).    Final Clinical Impressions(s) / UC Diagnoses   Final diagnoses:  Strep throat   Discharge Instructions   None    ED Prescriptions    Medication Sig Dispense Auth. Provider   amoxicillin (AMOXIL) 875 MG tablet Take 1 tablet (875 mg total) by mouth 2 (two) times daily. 20 tablet Elvina SidleLauenstein, Emmersyn Kratzke, MD     Controlled Substance Prescriptions [place] Controlled Substance Registry consulted? No   Elvina SidleLauenstein, Haylynn Pha, MD 10/03/18 1258

## 2018-10-15 ENCOUNTER — Ambulatory Visit: Payer: 59 | Admitting: Physical Therapy

## 2018-10-17 ENCOUNTER — Ambulatory Visit: Payer: 59 | Admitting: Physical Therapy

## 2018-10-22 ENCOUNTER — Ambulatory Visit: Payer: 59 | Admitting: Physical Therapy

## 2018-10-24 ENCOUNTER — Encounter: Payer: 59 | Admitting: Physical Therapy

## 2018-10-29 ENCOUNTER — Encounter: Payer: 59 | Admitting: Physical Therapy

## 2018-10-31 ENCOUNTER — Encounter: Payer: 59 | Admitting: Physical Therapy

## 2018-11-05 ENCOUNTER — Encounter: Payer: 59 | Admitting: Physical Therapy

## 2018-11-06 ENCOUNTER — Ambulatory Visit: Payer: 59 | Attending: Family Medicine | Admitting: Physical Therapy

## 2018-11-06 ENCOUNTER — Encounter: Payer: Self-pay | Admitting: Physical Therapy

## 2018-11-06 DIAGNOSIS — M6281 Muscle weakness (generalized): Secondary | ICD-10-CM | POA: Insufficient documentation

## 2018-11-06 DIAGNOSIS — M545 Low back pain: Secondary | ICD-10-CM | POA: Insufficient documentation

## 2018-11-06 DIAGNOSIS — M62838 Other muscle spasm: Secondary | ICD-10-CM | POA: Insufficient documentation

## 2018-11-06 DIAGNOSIS — G8929 Other chronic pain: Secondary | ICD-10-CM | POA: Diagnosis present

## 2018-11-06 NOTE — Therapy (Signed)
[facility] [address], [place], [address] Phone: [phone]   Fax:  [phone]  Physical Therapy Evaluation  Patient Details  Name: Amy Francis MRN: [id number] Date of Birth: July 17, 1986 Referring Provider (PT): Redina Harries, FNP   Encounter Date: 11/06/2018  PT End of Session - 11/06/18 1654    Visit Number  1    Number of Visits  12    Date for PT Re-Evaluation  12/18/18    Authorization Type  [employer] NAP reporting period from 11/06/2018    Authorization Time Period  Current Cert period: 11/06/2018 - 12/18/2018 (latest PN: IE 11/06/2018)    Authorization - Visit Number  1    Authorization - Number of Visits  10    PT Start Time  1525    PT Stop Time  1605    PT Time Calculation (min)  40 min    Activity Tolerance  Patient tolerated treatment well    Behavior During Therapy  [facility] for tasks assessed/performed       Past Medical History:  Diagnosis Date  . Anxiety     Past Surgical History:  Procedure Laterality Date  . APPENDECTOMY    . CESAREAN SECTION    . CESAREAN SECTION  2011    There were no vitals filed for this visit.   Subjective Assessment - 11/06/18 1631    Subjective  Patient states she has some low back pain that comes and goes for years with occasional severe flairs that require ibuprofen and tylenol. The last flair was a month or two ago when she went to see her medical provider who prescribed a Dosepak of steroids that really helped but did not fully resolve the problem. She notices it mostly in the morning when she gets out of bed. It is more so when she tries to lean back when standing up. When it first started, it came out of the blue and she missed a day of work because it was so painful. She demonstrates some trouble describing and recalling the course of her symptom    Pertinent History  Patient is a 33 y.o. female who presents to outpatient physical therapy with a referral  for medical diagnosis low back pain. This patient's chief complaints consist of pain, stiffness, decreased activity tolerance, previous paresthesia leading to the following functional deficits: difficulty completing her usual activities without pain and altered movement patterns (difficulty with sitting, stooping, standing up straight). Relevant past medical history and comorbidities include previous history of low back pain, hysterectomy, appendectomy, anxiety.    Diagnostic tests  Imaging: "IMPRESSION: Moderate degenerative disc space narrowing at L4-5. The other disc space heights are well maintained. No compression fracture or spondylolisthesis."    Patient Stated Goals  to feel better and not have repeated episodes of pain    Currently in Pain?  Yes    Pain Score  4    Patient reports current pain as 4/10, at best 0/10 (when on steroid), at worst 8.5/10.   Pain Location  Back   Location: base of lumbar spine. She thinks she has felt it into one leg quite a while.   Pain Orientation  Lower;Mid    Pain Descriptors / Indicators  Tightness   Nature: stiff, tight, "it hurts" Paresthesia: when sitting down for a long time (foot, either foot, goes away when she moves from that position).    Pain Type  Chronic pain    Pain  Radiating Towards  has radiated towards legs previously but pt cannot remember which or how long ago    Pain Onset  More than a month ago    Pain Frequency  Constant    Aggravating Factors   laying on stomach with pillows under chest, standing for a long time, prolonged sitting.     Pain Relieving Factors  sitting with better posture, medications (takes ibuprofen or tylenol), heat.     Effect of Pain on Daily Activities  she has to push through the pain to continue her usual activities, she must take medication to control the pain, affecting her enjoyment of her usual activities, difficulty laying in bed reading on stomach.           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       Cognition   Overall Cognitive Status  Within Functional Limits for tasks assessed      Observation/Other Assessments   Observations  see note from 11/06/2018 for latest objective exam        OBJECTIVE: OBSERVATION/INSPECTION: Patient presents with lumbar curve WNL.    NEUROLOGICAL: Dermatomes:  BLE WNL Myotomes: BLE WNL Reflexes:  - Quadriceps reflex (L4): B = 2+ - Achilles reflex (S1): R = 1+, L = 2+. Upper Motor Neuron Screen: Hoffman's, and Clonus (ankle) negative bilaterally.  Lumbar AROM:  *Indicates pain - Flexion: = fingers to floor, increased back pain - Extension: = 50%, increased back pain, stayed worse. . - Rotation: R = WNL, L = WNL except right sided pain. - Side Flexion: R = WNL but painful on left, L = WNL   PERIPHERAL JOINT MOTION (AROM/PROM in degrees):  *Indicates pain - BLE and BLE appears within functional limits grossly assessed except bilateral hip extension limited 50%.   STRENGTH:  *Indicates pain BLE WNL except:  Hip  - Extension: R = 4+/5*, L = 4+/5*.  REPEATED MOTIONS TESTING: RFIS (repeated lumbar flexion in standing): during = ; after = no pain REIS (repeated lumbar extension in standing): during =  Increasing pain; after = no worse REIL (prone press up): during = no pain ; after = feels looser   SPECIAL TESTS: Slump: B = negative  ACCESSORY MOTION:  - Tender to CPA at lumbar spine, worst at lowest  segments - Not Tender to CPA over sacrum  PALPATION: - Tension noted in lumbar paraspinals bilaterally  FUNCTIONAL MOBILITY: - Independent for all bed, transfer, and ambulation mobility.    Objective measurements completed on examination: See above findings.     TREATMENT:  Pt denies latex allergy Pt denies history of spinal surgery Patient denies history of long term steroid use  Therapeutic exercise: to centralize symptoms and improve ROM, strength, muscular endurance, and activity tolerance required for successful completion of functional activities.  - REIL (prone press up): during = no  pain ; after = feels looser  - postural correction in seated position with lumbar roll with instructions for home use, to improve lumbar position and decrease irritation during seated postures. - Education on HEP including handout  - Education on diagnosis, prognosis, POC, anatomy and physiology of current condition.     HOME EXERCISE PROGRAM Access Code: [email or web address]  URL: [email or web address]  Date: 11/06/2018  Prepared by: Norton Blizzard   Exercises  Prone Press Up - 10-15 reps - 1 second hold - 4x daily  Seated Correct Posture   Patient response to treatment:  Pt tolerated treatment well. Pt was able to complete all exercises with minimal to no lasting increase in pain or discomfort. She reported feeling relief from stiffness after prone press up exercise. Pt required multimodal cuing for proper technique and to facilitate improved neuromuscular control, strength, range of motion, and functional ability resulting in improved performance and form.     PT Education - 11/06/18 1654    Education Details  Exercise purpose/form. Self management techniques. Education on diagnosis, prognosis, POC, anatomy and physiology of current condition Education on HEP including handout     Person(s) Educated  Patient    Methods  Explanation;Tactile cues;Demonstration;Verbal cues     Comprehension  Verbalized understanding;Returned demonstration       PT Short Term Goals - 11/06/18 1646      PT SHORT TERM GOAL #1   Title  Be independent with initial home exercise program for self-management of symptoms.    Baseline  initial HEP provided at IE (11/06/2018);    Time  2    Period  Weeks    Status  New    Target Date  11/20/18        PT Long Term Goals - 11/06/18 1646      PT LONG TERM GOAL #1   Title  Be independent with a long-term home exercise program for self-management of symptoms.     Baseline  initial HEP provided at IE (11/06/2018);    Time  6    Period  Weeks    Status  New    Target Date  12/18/18      PT LONG TERM GOAL #2   Title  Have full lumbar AROM with no compensations or increase in pain in all planes except intermittent end range discomfort to allow patient to complete valued activities with less difficulty.     Baseline  see objective exam    Time  6    Period  Weeks    Status  New    Target Date  12/18/18      PT LONG TERM GOAL #3   Title  Improve B Hip strength to 4+/5 with no increase in pain for improved ability to allow patient to complete valued functional tasks such as work activities without pain.      Baseline  hip extension painful (11/06/2018);     Time  6    Period  Weeks    Status  New    Target Date  12/18/18      PT LONG TERM GOAL #4   Title  Be able to squat to chair height with proper form without limitation due to current condition in order to improve ability to lift and complete transfers during usual activities.     Baseline  not tested at baseline    Time  6    Period  Weeks    Status  New    Target  Date  12/18/18      PT LONG TERM GOAL #5   Title  Complete community, work and/or recreational activities without limitation due to current condition.     Baseline  difficulty with work, sitting, standing up due to pain    Time  6    Period  Weeks    Status  New    Target Date  12/18/18           Plan -  11/06/18 1641    Clinical Impression Statement  Patient is a 33 y.o. female referred to outpatient physical therapy with a medical diagnosis of chronic bilateral low back pain without sciatica who presents with signs and symptoms consistent with chronic low back pain with history of radiation into the lower extremities.  Patient presents with significant pain, stiffness, reduced activity tolerance, increased muscular tension impairments that are limiting ability to complete sitting, bending, lifting, ADLs, IALDs, and work activities without difficulty. Patient will benefit from skilled physical therapy intervention to address current body structure impairments and activity limitations to improve function and work towards goals set in current POC in order to return to prior level of function or maximal functional improvement.     History and Personal Factors relevant to plan of care:  history of low back pain, hysterectomy, appendectomy, anxiety.    Clinical Presentation  Evolving    Clinical Presentation due to:  episodic at this point and patient cannot predict when    Clinical Decision Making  Low    Rehab Potential  Good    Clinical Impairments Affecting Rehab Potential  (+) motivated, young, relatively healthy; (-) restricted schedule, financial limitations that could limit attendance, chronic nature of symptoms.     PT Frequency  2x / week    PT Duration  6 weeks    PT Treatment/Interventions  ADLs/Self Care Home Management;Electrical Stimulation;Moist Heat;Cryotherapy;Therapeutic activities;Therapeutic exercise;Neuromuscular re-education;Patient/family education;Manual techniques;Passive range of motion;Dry needling;Joint Manipulations;Spinal Manipulations;Other (comment)   joint mobilizations grades I-IV   PT Next Visit Plan  assess response to HEP and update as appropriate. Consider manual therapy     PT Home Exercise Plan  Medbridge Access Code: [email or web address]     Consulted and Agree with Plan of  Care  Patient       Patient will benefit from skilled therapeutic intervention in order to improve the following deficits and impairments:  Hypomobility, Increased muscle spasms, Impaired sensation, Decreased range of motion, Impaired perceived functional ability, Pain, Postural dysfunction, Decreased strength, Decreased activity tolerance  Visit Diagnosis: Chronic bilateral low back pain, unspecified whether sciatica present  Other muscle spasm  Muscle weakness (generalized)     Problem List Patient Active Problem List   Diagnosis Date Noted  . Abdominal pain, epigastric 07/21/2016  . Carpal tunnel syndrome 10/14/2013  . Unspecified vitamin D deficiency 07/21/2013  . Anxiety state, unspecified 07/14/2013  . Paresthesia 07/14/2013  . Atypical chest pain 07/14/2013  . Obesity (BMI 30-39.9) 07/14/2013    Cira RueSara R Zaryia Markel, PT, DPT 11/06/2018, 4:55 PM  [facility] [address] [place], [place], [address] Phone: [phone]   Fax:  [phone]  Name: Chrissie Noaracey L Sangalang MRN: [id number] Date of Birth: 08/16/1986

## 2018-11-07 ENCOUNTER — Ambulatory Visit: Payer: 59 | Admitting: Physical Therapy

## 2018-11-07 ENCOUNTER — Encounter: Payer: 59 | Admitting: Physical Therapy

## 2018-11-11 ENCOUNTER — Telehealth: Payer: Self-pay | Admitting: Physical Therapy

## 2018-11-11 ENCOUNTER — Ambulatory Visit: Payer: 59 | Attending: Family Medicine | Admitting: Physical Therapy

## 2018-11-11 DIAGNOSIS — M62838 Other muscle spasm: Secondary | ICD-10-CM | POA: Insufficient documentation

## 2018-11-11 DIAGNOSIS — M545 Low back pain: Secondary | ICD-10-CM | POA: Insufficient documentation

## 2018-11-11 DIAGNOSIS — G8929 Other chronic pain: Secondary | ICD-10-CM | POA: Insufficient documentation

## 2018-11-11 DIAGNOSIS — M6281 Muscle weakness (generalized): Secondary | ICD-10-CM | POA: Insufficient documentation

## 2018-11-11 NOTE — Telephone Encounter (Signed)
Called pt when she did not show up for her appointment today at 4:30pm. Pt did not answer the phone, but it sounded like the phone had been turned on so I started talking and she answered. She stated that she totally forgot about it and did not write it down. Sounded sleepy. I offered to see her at 6:15pm tonight when I have my next opening, but she said she cannot make it and apologized for missing. Confirmed that her next scheduled appt 11/20/2018 at 4:30pm is still a good time for her. She said it was and that she planned to come.

## 2018-11-14 ENCOUNTER — Ambulatory Visit: Payer: 59 | Admitting: Physical Therapy

## 2018-11-20 ENCOUNTER — Ambulatory Visit: Payer: 59 | Admitting: Physical Therapy

## 2018-11-20 ENCOUNTER — Ambulatory Visit (INDEPENDENT_AMBULATORY_CARE_PROVIDER_SITE_OTHER): Payer: 59 | Admitting: Family

## 2018-11-20 ENCOUNTER — Encounter: Payer: Self-pay | Admitting: Family

## 2018-11-20 VITALS — BP 128/90 | HR 98 | Temp 98.5°F | Resp 16 | Ht 65.0 in | Wt 222.0 lb

## 2018-11-20 DIAGNOSIS — R030 Elevated blood-pressure reading, without diagnosis of hypertension: Secondary | ICD-10-CM

## 2018-11-20 DIAGNOSIS — Z0000 Encounter for general adult medical examination without abnormal findings: Secondary | ICD-10-CM | POA: Diagnosis not present

## 2018-11-20 LAB — BASIC METABOLIC PANEL
BUN: 14 mg/dL (ref 6–23)
CALCIUM: 9.6 mg/dL (ref 8.4–10.5)
CO2: 28 meq/L (ref 19–32)
CREATININE: 0.84 mg/dL (ref 0.40–1.20)
Chloride: 102 mEq/L (ref 96–112)
GFR: 94.33 mL/min (ref >60.00)
Glucose, Bld: 85 mg/dL (ref 70–99)
Potassium: 4.3 mEq/L (ref 3.5–5.1)
Sodium: 137 mEq/L (ref 135–145)

## 2018-11-20 LAB — LIPID PANEL
CHOL/HDL RATIO: 3
Cholesterol: 167 mg/dL (ref 0–200)
HDL: 50.1 mg/dL (ref >39.00)
LDL Cholesterol: 87 mg/dL (ref 0–99)
NONHDL: 116.46
TRIGLYCERIDES: 146 mg/dL (ref 0.0–149.0)
VLDL: 29.2 mg/dL (ref 0.0–40.0)

## 2018-11-20 LAB — CBC WITH DIFFERENTIAL/PLATELET
BASOS ABS: 0.1 10*3/uL (ref 0.0–0.1)
Basophils Relative: 1.4 % (ref 0.0–3.0)
EOS ABS: 0.2 10*3/uL (ref 0.0–0.7)
Eosinophils Relative: 2.6 % (ref 0.0–5.0)
HCT: 41.5 % (ref 36.0–46.0)
Hemoglobin: 13.9 g/dL (ref 12.0–15.0)
LYMPHS ABS: 3 10*3/uL (ref 0.7–4.0)
Lymphocytes Relative: 40.7 % (ref 12.0–46.0)
MCHC: 33.4 g/dL (ref 30.0–36.0)
MCV: 91 fl (ref 78.0–100.0)
Monocytes Absolute: 0.6 10*3/uL (ref 0.1–1.0)
Monocytes Relative: 7.8 % (ref 3.0–12.0)
NEUTROS ABS: 3.5 10*3/uL (ref 1.4–7.7)
NEUTROS PCT: 47.5 % (ref 43.0–77.0)
PLATELETS: 272 10*3/uL (ref 150.0–400.0)
RBC: 4.56 Mil/uL (ref 3.87–5.11)
RDW: 12.7 % (ref 11.5–15.5)
WBC: 7.4 10*3/uL (ref 4.0–10.5)

## 2018-11-20 LAB — URINALYSIS, ROUTINE W REFLEX MICROSCOPIC
BILIRUBIN URINE: NEGATIVE
HGB URINE DIPSTICK: NEGATIVE
Ketones, ur: 15 — AB
Leukocytes,Ua: NEGATIVE
Nitrite: NEGATIVE
PH: 7 (ref 5.0–8.0)
RBC / HPF: NONE SEEN (ref 0–?)
Specific Gravity, Urine: 1.015 (ref 1.000–1.030)
TOTAL PROTEIN, URINE-UPE24: NEGATIVE
UROBILINOGEN UA: 1 (ref 0.0–1.0)
Urine Glucose: NEGATIVE

## 2018-11-20 LAB — HEPATIC FUNCTION PANEL
ALK PHOS: 45 U/L (ref 39–117)
ALT: 20 U/L (ref 0–35)
AST: 20 U/L (ref 0–37)
Albumin: 4.4 g/dL (ref 3.5–5.2)
BILIRUBIN DIRECT: 0.1 mg/dL (ref 0.0–0.3)
Total Bilirubin: 0.7 mg/dL (ref 0.2–1.2)
Total Protein: 7.2 g/dL (ref 6.0–8.3)

## 2018-11-20 LAB — TSH: TSH: 0.93 u[IU]/mL (ref 0.35–4.50)

## 2018-11-20 MED ORDER — MELOXICAM 7.5 MG PO TABS: 7.5000 mg | ORAL_TABLET | Freq: Every day | ORAL | 0 refills | Status: DC | PRN

## 2018-11-20 NOTE — Patient Instructions (Signed)
Please complete lab work prior to leaving.  Work on low sodium diet, exercise and weight loss.  

## 2018-11-20 NOTE — Progress Notes (Signed)
Subjective:    Patient ID: Amy Francis, female    DOB: 10/15/1985, 33 y.o.   MRN: [id number]  HPI  Patient presents today for complete physical.  Immunizations:  Flu due. Tetanus reported 2015 Diet:  Reports that she is trying to improve her diet.  Exercise: 1-2 days a week Pap Smear: 01/23/18 Vision: up to date Dental: up to date   Wt Readings from Last 3 Encounters:  11/20/18 222 lb (100.7 kg)  09/03/18 222 lb 6.4 oz (100.9 kg)  04/10/17 217 lb 12.8 oz (98.8 kg)    Carpal tunnel- L>right,  Review of Systems  Constitutional: Negative for unexpected weight change.  HENT: Negative for hearing loss and rhinorrhea.   Eyes: Negative for visual disturbance.  Respiratory: Negative for cough.   Cardiovascular: Negative for leg swelling.  Gastrointestinal: Negative for constipation and diarrhea.  Genitourinary: Negative for dysuria, frequency and menstrual problem.  Musculoskeletal: Negative for arthralgias and myalgias.  Skin: Negative for rash.  Neurological: Negative for headaches.  Hematological: Negative for adenopathy.  Psychiatric/Behavioral:       Denies depression/anxiety   Past Medical History:  Diagnosis Date  . Anxiety      Social History   Socioeconomic History  . Marital status: Single    Spouse name: Not on file  . Number of children: Not on file  . Years of education: Not on file  . Highest education level: Not on file  Occupational History  . Not on file  Social Needs  . Financial resource strain: Not on file  . Food insecurity:    Worry: Not on file    Inability: Not on file  . Transportation needs:    Medical: Not on file    Non-medical: Not on file  Tobacco Use  . Smoking status: Never Smoker  . Smokeless tobacco: Never Used  Substance and Sexual Activity  . Alcohol use: No  . Drug use: No  . Sexual activity: Yes    Birth control/protection: Condom  Lifestyle  . Physical activity:    Days per week: Not on file    Minutes per  session: Not on file  . Stress: Not on file  Relationships  . Social connections:    Talks on phone: Not on file    Gets together: Not on file    Attends religious service: Not on file    Active member of club or organization: Not on file    Attends meetings of clubs or organizations: Not on file    Relationship status: Not on file  . Intimate partner violence:    Fear of current or ex partner: Not on file    Emotionally abused: Not on file    Physically abused: Not on file    Forced sexual activity: Not on file  Other Topics Concern  . Not on file  Social History Narrative   Parent educator   Daughter- [name] born 2011   Completed bachelors degree   Single   She has a good support system          Past Surgical History:  Procedure Laterality Date  . APPENDECTOMY    . CESAREAN SECTION    . CESAREAN SECTION  2011    Family History  Problem Relation Age of Onset  . Diabetes Mother        deceased from DM complication  . Hyperlipidemia Father     No Known Allergies  No current outpatient medications on file prior to visit.  No current facility-administered medications on file prior to visit.     BP 128/90 (BP Location: Left Arm, Patient Position: Sitting, Cuff Size: Large)   Pulse 98   Temp 98.5 F (36.9 C) (Oral)   Resp 16   Ht 5\' 5"  (1.651 m)   Wt 222 lb (100.7 kg)   LMP 11/06/2018   SpO2 100%   BMI 36.94 kg/m        Objective:   Physical Exam  Physical Exam  Constitutional: She is oriented to person, place, and time. She appears well-developed and well-nourished. No distress.  HENT:  Head: Normocephalic and atraumatic.  Right Ear: Tympanic membrane and ear canal normal.  Left Ear: Tympanic membrane and ear canal normal.  Mouth/Throat: Oropharynx is clear and moist.  Eyes: Pupils are equal, round, and reactive to light. No scleral icterus.  Neck: Normal range of motion. No thyromegaly present.  Cardiovascular: Normal rate and regular rhythm.     No murmur heard. Pulmonary/Chest: Effort normal and breath sounds normal. No respiratory distress. He has no wheezes. She has no rales. She exhibits no tenderness.  Abdominal: Soft. Bowel sounds are normal. She exhibits no distension and no mass. There is no tenderness. There is no rebound and no guarding.  Musculoskeletal: She exhibits no edema.  Lymphadenopathy:    She has no cervical adenopathy.  Neurological: She is alert and oriented to person, place, and time. She has normal patellar reflexes. She exhibits normal muscle tone. Coordination normal.  Skin: Skin is warm and dry.  Psychiatric: She has a normal mood and affect. Her behavior is normal. Judgment and thought content normal.  Breasts: Examined lying Right: Without masses, retractions, discharge or axillary adenopathy.  Left: Without masses, retractions, discharge or axillary adenopathy.            Assessment & Plan:   Preventative care- flu shot today. Tetanus up to date.  Obtain routine lab work. Pap up to date. Discussed diet, exercise and weight loss.  Elevated blood pressure reading-discussed low sodium diet, weight loss. Plan to repeat blood pressure in 3 months.  If still elevated will consider addition of antihypertensive.       Assessment & Plan:   BP Readings from Last 3 Encounters:  11/20/18 128/90  10/03/18 (!) 146/84  09/03/18 138/86

## 2018-11-25 ENCOUNTER — Ambulatory Visit: Payer: 59 | Admitting: Physical Therapy

## 2018-11-25 ENCOUNTER — Telehealth: Payer: Self-pay | Admitting: Physical Therapy

## 2018-11-25 DIAGNOSIS — M6281 Muscle weakness (generalized): Secondary | ICD-10-CM | POA: Diagnosis present

## 2018-11-25 DIAGNOSIS — M545 Low back pain: Principal | ICD-10-CM

## 2018-11-25 DIAGNOSIS — G8929 Other chronic pain: Secondary | ICD-10-CM

## 2018-11-25 DIAGNOSIS — M62838 Other muscle spasm: Secondary | ICD-10-CM | POA: Diagnosis present

## 2018-11-25 NOTE — Telephone Encounter (Signed)
Called pt when she no-showed to today's appt at 4:45pm. She answered and said she thought her appt. was tomorrow. She was offered to move to 5:30p today and she accepted.

## 2018-11-25 NOTE — Therapy (Signed)
[facility] [address], [place], [address] Phone: [phone]   Fax:  [phone]  Physical Therapy Treatment  Patient Details  Name: Amy Francis MRN: [id number] Date of Birth: [birth year] Referring Provider (PT): Jodelle Green, FNP   Encounter Date: 11/25/2018  PT End of Session - 11/25/18 1749    Visit Number  2    Number of Visits  12    Date for PT Re-Evaluation  12/18/18    Authorization Type  [employer] NAP reporting period from 11/06/2018    Authorization Time Period  Current Cert period: 1/32/4401 - 12/18/2018 (latest PN: [facility] 11/06/2018)    Authorization - Visit Number  2    Authorization - Number of Visits  10    PT Start Time  1740    PT Stop Time  1815    PT Time Calculation (min)  35 min    Activity Tolerance  Patient tolerated treatment well    Behavior During Therapy  [facility] for tasks assessed/performed       Past Medical History:  Diagnosis Date  . Anxiety     Past Surgical History:  Procedure Laterality Date  . APPENDECTOMY    . CESAREAN SECTION    . CESAREAN SECTION  2011    There were no vitals filed for this visit.  Subjective Assessment - 11/25/18 1745    Subjective  Patient reports no pain upon arrival. She has been really busy, so this is the first time she has been back since her initial eval. Her pain gets up to 6/10 mostly when she is laying down (moring is worse), She lays prone with pillows propped up. She feels like her condition is getting better. She thinks it is less intense but still present. She has been performing her exercises 2-3 times per day. Reports no pain during the exercises. She feels like her back starts to hurt if she lays on her stomahc for 10 minutes.     Pertinent History  Patient is a 33 y.o. female who presents to outpatient physical therapy with a referral for medical diagnosis low back pain. This patient's chief complaints consist of pain, stiffness,  decreased activity tolerance, previous paresthesia leading to the following functional deficits: difficulty completing her usual activities without pain and altered movement patterns (difficulty with sitting, stooping, standing up straight). Relevant past medical history and comorbidities include previous history of low back pain, hysterectomy, appendectomy, anxiety.    Diagnostic tests  Imaging: "IMPRESSION: Moderate degenerative disc space narrowing at L4-5. The other disc space heights are well maintained. No compression fracture or spondylolisthesis."    Patient Stated Goals  to feel better and not have repeated episodes of pain    Currently in Pain?  No/denies    Pain Type  Chronic pain    Pain Onset  More than a month ago         TREATMENT:  Pt denies latex allergy Pt denies history of spinal surgery Patient denies history of long term steroid use  Therapeutic exercise:to centralize symptoms and improve ROM, strength, muscular endurance, and activity tolerance required for successful completion of functional activities.  Treadmill up to 1.9 mph, no grade.. For improved lower extremity mobility, muscular endurance, and weightbearing activity tolerance; and to induce the analgesic effect of aerobic exercise, stimulate improved joint nutrition, and prepare body structures and systems for following interventions. x6 min during subjective exam and education.  - REIL (prone press up)  x 15 - Quadruped bird dog (alternating shoulder flexion/contralateral hip extension with core muscles braced) , with cone over sacrum for biofeedback to help keep core stabilized. Cuing for abdominal brace. x10 min each side plus time for instruction, rest, and transition. - Hooklying dead bug (hooklying with knees, and shoulders flexed to 90 starting position then alternating hip/knee extension and contralateral shoulder flexion with abdominal activation). X 10 each side plus time for instruction, rest, and  transition. cuing for care activation. Attempted with feet off ground but too difficult to perform comfortably.  - plank: front plank, from knees x 60 seconds, toes to elbows 10 seconds x 4. To improve trunk strength and motor control. Cuing for form.  - plank: modified side plank (elbow to knees): 15 seconds x 3 each side. To improve lateral trunk strength and motor control. Cuing for form.  - Hooklying bridge. Cuing to keep abdominals tight and continue breathing. x10 reps with time for appropriate rest, transition and instructions to complete activity correctly. - Education on HEP including handout  - Education on diagnosis, prognosis, POC, anatomy and physiology of current condition.    HOME EXERCISE PROGRAM Access Code: [email or web address]  URL: [email or web address]  Date: 11/25/2018  Prepared by: Rosita Kea   Exercises  Bird Dog - 10-15 reps - 1 second hold - 3 Sets - 1x daily - 7x weekly  Supine Dead Bug with Leg Extension - 10-15 reps - 1 second hold - 3 Sets - 1x daily - 7x weekly  Supine Bridge - 10-15 reps - 1 second hold - 3 Sets - 1x daily - 7x weekly  Side Plank on Knees - 3 reps - 15 second hold - 1x daily - 7x weekly   Patient response to treatment:  Pt tolerated treatment well. Pt was able to complete all exercises with minimal to no lasting increase in pain or discomfort. Exercises were changed and HEP updated to include more strengthening/motor control exercises due to lack of effect of specific exercise. Pt required multimodal cuing for proper technique and to facilitate improved neuromuscular control, strength, range of motion, and functional ability resulting in improved performance and form.      PT Education - 11/25/18 1749    Education Details  Exercise purpose/form. Self management techniques. Education on diagnosis, prognosis, POC, anatomy and physiology of current condition Education on HEP including handout    Person(s) Educated  Patient    Methods   Explanation;Demonstration;Tactile cues;Verbal cues    Comprehension  Verbalized understanding;Returned demonstration       PT Short Term Goals - 11/26/18 1352      PT SHORT TERM GOAL #1   Title  Be independent with initial home exercise program for self-management of symptoms.    Baseline  initial HEP provided at IE (11/06/2018);    Time  2    Period  Weeks    Status  Partially Met    Target Date  12/10/18        PT Long Term Goals - 11/06/18 1646      PT LONG TERM GOAL #1   Title  Be independent with a long-term home exercise program for self-management of symptoms.     Baseline  initial HEP provided at IE (11/06/2018);    Time  6    Period  Weeks    Status  New    Target Date  12/18/18      PT LONG TERM GOAL #2   Title  Have full lumbar AROM with  no compensations or increase in pain in all planes except intermittent end range discomfort to allow patient to complete valued activities with less difficulty.     Baseline  see objective exam    Time  6    Period  Weeks    Status  New    Target Date  12/18/18      PT LONG TERM GOAL #3   Title  Improve B Hip strength to 4+/5 with no increase in pain for improved ability to allow patient to complete valued functional tasks such as work activities without pain.      Baseline  hip extension painful (11/06/2018);     Time  6    Period  Weeks    Status  New    Target Date  12/18/18      PT LONG TERM GOAL #4   Title  Be able to squat to chair height with proper form without limitation due to current condition in order to improve ability to lift and complete transfers during usual activities.     Baseline  not tested at baseline    Time  6    Period  Weeks    Status  New    Target Date  12/18/18      PT LONG TERM GOAL #5   Title  Complete community, work and/or recreational activities without limitation due to current condition.     Baseline  difficulty with work, sitting, standing up due to pain    Time  6    Period  Weeks     Status  New    Target Date  12/18/18            Plan - 11/26/18 1351    Clinical Impression Statement  Patient has had poor attendance and is making some progress towards goals. This was her first return visit since initial evaluation over 2 weeks ago, and she was late after being rescheduled from forgetting appt earlier in the day. Lack of attendance is hindering her progress, but she is no worse at this point. No significant response to HEP of specific exercise so updated with tolerated trunk strength/motor control exercises today.  Patient is a 33 y.o. female referred to outpatient physical therapy with a medical diagnosis of chronic bilateral low back pain without sciatica who presents with signs and symptoms consistent with chronic low back pain with history of radiation into the lower extremities. Patient presents with significant pain, stiffness, reduced activity tolerance, increased muscular tension impairments that are limiting ability to complete sitting, bending, lifting, ADLs, IALDs, and work activities without difficulty. Patient will benefit from skilled physical therapy intervention to address current body structure impairments and activity limitations to improve function and work towards goals set in current POC in order to return to prior level of function or maximal functional improvement.     Rehab Potential  Good    Clinical Impairments Affecting Rehab Potential  (+) motivated, young, relatively healthy; (-) restricted schedule, financial limitations that could limit attendance, chronic nature of symptoms.     PT Frequency  2x / week    PT Duration  6 weeks    PT Treatment/Interventions  ADLs/Self Care Home Management;Electrical Stimulation;Moist Heat;Cryotherapy;Therapeutic activities;Therapeutic exercise;Neuromuscular re-education;Patient/family education;Manual techniques;Passive range of motion;Dry needling;Joint Manipulations;Spinal Manipulations;Other (comment)   joint  mobilizations grades I-IV   PT Next Visit Plan  assess response to HEP and update as appropriate. Consider manual therapy     PT Home Exercise Plan  Medbridge Access Code: [email or web address]     Consulted and Agree with Plan of Care  Patient       Patient will benefit from skilled therapeutic intervention in order to improve the following deficits and impairments:  Hypomobility, Increased muscle spasms, Impaired sensation, Decreased range of motion, Impaired perceived functional ability, Pain, Postural dysfunction, Decreased strength, Decreased activity tolerance  Visit Diagnosis: Chronic bilateral low back pain, unspecified whether sciatica present  Other muscle spasm  Muscle weakness (generalized)     Problem List Patient Active Problem List   Diagnosis Date Noted  . Abdominal pain, epigastric 07/21/2016  . Carpal tunnel syndrome 10/14/2013  . Unspecified vitamin D deficiency 07/21/2013  . Anxiety state, unspecified 07/14/2013  . Paresthesia 07/14/2013  . Atypical chest pain 07/14/2013  . Obesity (BMI 30-39.9) 07/14/2013    Nancy Nordmann, PT, DPT 11/26/2018, 1:53 PM  [facility] [address], [place], [address] Phone: [phone]   Fax:  [phone]  Name: Amy Francis MRN: [id number] Date of Birth: April 16, 1986

## 2018-11-26 ENCOUNTER — Encounter: Payer: Self-pay | Admitting: Physical Therapy

## 2018-11-27 ENCOUNTER — Telehealth: Payer: Self-pay

## 2018-11-27 ENCOUNTER — Ambulatory Visit: Payer: 59 | Admitting: Physical Therapy

## 2018-11-27 NOTE — Telephone Encounter (Signed)
Copied from CRM [id number]. Topic: General - Other >> Nov 12, 2018  1:23 PM Jaquita Rector A wrote: Reason for CRM: Patient called to get the permission of Sandford Craze to have her Physical scheduled at the [facility] office. She is requesting a call back with an answer please. 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 Please advise Ph# [phone]

## 2018-11-28 NOTE — Telephone Encounter (Signed)
I called the patient , but I could not leave a voicemail her mail box was full. I sent a mychart message asking the patient to call the office to schedule her physical.

## 2018-11-28 NOTE — Telephone Encounter (Signed)
Yes I will!  Erie Noe, please schedule her for CPE -- does not have to be scheduled urgently  Thanks,   LG

## 2018-11-28 NOTE — Telephone Encounter (Signed)
Amy Francis has agreed to see this patient for CPX at [facility].  Lauren, could you please forward to your staff to reach out and schedule this for her? I am not sure who to send to at your office. Thanks!

## 2018-12-02 ENCOUNTER — Ambulatory Visit: Payer: 59 | Admitting: Physical Therapy

## 2018-12-03 ENCOUNTER — Ambulatory Visit: Payer: Self-pay | Admitting: *Deleted

## 2018-12-03 NOTE — Telephone Encounter (Signed)
Message from Amy Francis sent at 12/03/2018 11:22 AM EST   Summary: medication questions    Pt called and stated that she was taking phentermine and would like to know if it causes high blood pressure.          Returned pt call regarding one of the side effect of phentermine. Per reference, hypertension is one of the adverse reaction to this medication. Pt stated she is not on it any more but just wanted to check and see because her b/p had been higher than normal at her provider's office.  Advised patient that she should also look at her diet, salt intake, stress and water regarding her blood pressure. She also has been told she has white coat syndrome. Pt voiced understanding. Will call back for any other questions or concerns. Routing to flow at [facility] at [facility].Marland Kitchen

## 2018-12-04 ENCOUNTER — Ambulatory Visit: Payer: 59 | Admitting: Physical Therapy

## 2018-12-04 NOTE — Telephone Encounter (Signed)
Please advise 

## 2018-12-10 ENCOUNTER — Ambulatory Visit: Payer: 59 | Attending: Family Medicine | Admitting: Physical Therapy

## 2018-12-17 ENCOUNTER — Ambulatory Visit: Payer: 59 | Admitting: Physical Therapy

## 2018-12-17 ENCOUNTER — Encounter: Payer: Self-pay | Admitting: Physical Therapy

## 2018-12-17 DIAGNOSIS — M545 Low back pain, unspecified: Secondary | ICD-10-CM

## 2018-12-17 DIAGNOSIS — G8929 Other chronic pain: Secondary | ICD-10-CM

## 2018-12-17 DIAGNOSIS — M62838 Other muscle spasm: Secondary | ICD-10-CM

## 2018-12-17 DIAGNOSIS — M6281 Muscle weakness (generalized): Secondary | ICD-10-CM

## 2018-12-17 NOTE — Therapy (Signed)
[facility] [address] [place], [address] Phone: [phone]   Fax:  [phone]  Physical Therapy Discharge Summary Reporting period: 11/06/2018 - 12/17/2018  Patient Details  Name: Amy Francis MRN: [id number] Date of Birth: [birth year] Referring Provider (PT): Jodelle Green, FNP   Encounter Date: 12/17/2018    Past Medical History:  Diagnosis Date  . Anxiety     Past Surgical History:  Procedure Laterality Date  . APPENDECTOMY    . CESAREAN SECTION    . CESAREAN SECTION  2011    There were no vitals filed for this visit.  Subjective Assessment - 12/17/18 1652    Subjective  Patient attended one following after initial eval after several last minute cancels, no-shows, and late arrivals. She has now no-showed three times in a row and will be discharged from physical therapy for lack of participation.     Pertinent History  Patient is a 33 y.o. female who presents to outpatient physical therapy with a referral for medical diagnosis low back pain. This patient's chief complaints consist of pain, stiffness, decreased activity tolerance, previous paresthesia leading to the following functional deficits: difficulty completing her usual activities without pain and altered movement patterns (difficulty with sitting, stooping, standing up straight). Relevant past medical history and comorbidities include previous history of low back pain, hysterectomy, appendectomy, anxiety.    Diagnostic tests  Imaging: "IMPRESSION: Moderate degenerative disc space narrowing at L4-5. The other disc space heights are well maintained. No compression fracture or spondylolisthesis."    Patient Stated Goals  to feel better and not have repeated episodes of pain    Pain Onset  More than a month ago         OBJECTIVE:  Pt not present for examination at time of discharge. Please see previous documentation for latest objective  data.      PT Short Term Goals - 12/17/18 1654      PT SHORT TERM GOAL #1   Title  Be independent with initial home exercise program for self-management of symptoms.    Baseline  initial HEP provided at IE (11/06/2018);    Time  2    Period  Weeks    Status  Not Met    Target Date  12/10/18        PT Long Term Goals - 12/17/18 1655      PT LONG TERM GOAL #1   Title  Be independent with a long-term home exercise program for self-management of symptoms.     Baseline  initial HEP provided at IE (11/06/2018);    Time  6    Period  Weeks    Status  Not Met    Target Date  12/18/18      PT LONG TERM GOAL #2   Title  Have full lumbar AROM with no compensations or increase in pain in all planes except intermittent end range discomfort to allow patient to complete valued activities with less difficulty.     Baseline  see objective exam    Time  6    Period  Weeks    Status  Not Met    Target Date  12/18/18      PT LONG TERM GOAL #3   Title  Improve B Hip strength to 4+/5 with no increase in pain for improved ability to allow patient to complete valued functional tasks such as work activities without pain.  Baseline  hip extension painful (11/06/2018);     Time  6    Period  Weeks    Status  Not Met    Target Date  12/18/18      PT LONG TERM GOAL #4   Title  Be able to squat to chair height with proper form without limitation due to current condition in order to improve ability to lift and complete transfers during usual activities.     Baseline  not tested at baseline    Time  6    Period  Weeks    Status  Not Met    Target Date  12/18/18      PT LONG TERM GOAL #5   Title  Complete community, work and/or recreational activities without limitation due to current condition.     Baseline  difficulty with work, sitting, standing up due to pain    Time  6    Period  Weeks    Status  Not Met    Target Date  12/18/18            Plan - 12/17/18 1653    Clinical  Impression Statement  Patient has had poor attendance, with only one follow up over two weeks following her initial evaluation. She has now no-shown to 3 consecutive appointments and will be discharged for lack of participation. She has been unable to make progress towards stated goals for the same reason and she never received a final HEP due to lack of attendance.     Rehab Potential  Good    Clinical Impairments Affecting Rehab Potential  (+) motivated, young, relatively healthy; (-) restricted schedule, financial limitations that could limit attendance, chronic nature of symptoms.     PT Frequency  2x / week    PT Duration  6 weeks    PT Treatment/Interventions  ADLs/Self Care Home Management;Electrical Stimulation;Moist Heat;Cryotherapy;Therapeutic activities;Therapeutic exercise;Neuromuscular re-education;Patient/family education;Manual techniques;Passive range of motion;Dry needling;Joint Manipulations;Spinal Manipulations;Other (comment)   joint mobilizations grades I-IV   PT Next Visit Plan  pt is now discharged from physical therapy due to lack of participation.     PT Home Exercise Plan  Medbridge Access Code: [email or web address]     Consulted and Agree with Plan of Care  Patient       Patient will benefit from skilled therapeutic intervention in order to improve the following deficits and impairments:  Hypomobility, Increased muscle spasms, Impaired sensation, Decreased range of motion, Impaired perceived functional ability, Pain, Postural dysfunction, Decreased strength, Decreased activity tolerance  Visit Diagnosis: Chronic bilateral low back pain, unspecified whether sciatica present  Other muscle spasm  Muscle weakness (generalized)     Problem List Patient Active Problem List   Diagnosis Date Noted  . Abdominal pain, epigastric 07/21/2016  . Carpal tunnel syndrome 10/14/2013  . Unspecified vitamin D deficiency 07/21/2013  . Anxiety state, unspecified 07/14/2013  . Paresthesia  07/14/2013  . Atypical chest pain 07/14/2013  . Obesity (BMI 30-39.9) 07/14/2013    Nancy Nordmann, PT, DPT 12/17/2018, 4:55 PM  [facility] [address], [place], [address] Phone: [phone]   Fax:  [phone]  Name: RUDENE POULSEN MRN: [id number] Date of Birth: 1986/02/26

## 2019-02-05 ENCOUNTER — Telehealth: Payer: Self-pay | Admitting: *Deleted

## 2019-02-05 NOTE — Telephone Encounter (Signed)
Pt will be due for a 3 month follow up of her elevated BP readings on 02/18/19. Attempted to contact pt regarding mychart video visit and to schedule appt and left message for pt to return my call.

## 2019-02-05 NOTE — Telephone Encounter (Signed)
Pt had called back and scheduled video visit for 02/07/19.

## 2019-02-07 ENCOUNTER — Other Ambulatory Visit: Payer: Self-pay

## 2019-02-07 ENCOUNTER — Telehealth (INDEPENDENT_AMBULATORY_CARE_PROVIDER_SITE_OTHER): Payer: 59 | Admitting: Family

## 2019-02-07 DIAGNOSIS — R030 Elevated blood-pressure reading, without diagnosis of hypertension: Secondary | ICD-10-CM

## 2019-02-07 DIAGNOSIS — E669 Obesity, unspecified: Secondary | ICD-10-CM | POA: Diagnosis not present

## 2019-02-07 NOTE — Progress Notes (Signed)
Virtual Visit via Video Note  I connected with Amy Francis on 02/07/19 at  9:40 AM EDT by a video enabled telemedicine application and verified that I am speaking with the correct person using two identifiers. This visit type was conducted due to national recommendations for restrictions regarding the COVID-19 Pandemic (e.g. social distancing).  This format is felt to be most appropriate for this patient at this time.   I discussed the limitations of evaluation and management by telemedicine and the availability of in person appointments. The patient expressed understanding and agreed to proceed.  Only the patient and myself were on today's video visit. The patient was at home and I was in my office at the time of today's visit.   History of Present Illness:  Patient is a 33 yr old female who presents today for follow up. We last saw her in February.  She was noted to have mild elevation of DBP at that time and a higher reading in December. She had been on phentermine at one time but she is no longer taking.   Reports that she had a reading of 114/80 in January at the weight loss clinic.   BP Readings from Last 3 Encounters:  11/20/18 128/90  10/03/18 (!) 146/84  09/03/18 138/86   Reports that she has been working on diet and exercise.  Reports that it has been harder since she has been home. Wt Readings from Last 3 Encounters:  11/20/18 222 lb (100.7 kg)  09/03/18 222 lb 6.4 oz (100.9 kg)  04/10/17 217 lb 12.8 oz (98.8 kg)    Observations/Objective:  Gen: Awake, alert, no acute distress Resp: Breathing is even and non-labored Psych: calm/pleasant demeanor Neuro: Alert and Oriented x 3, + facial symmetry, speech is clear.   Assessment and Plan:  Obesity- requesting referral to nutritionist. Referral has been placed. We discussed healthy low sodium diet today.  Elevated blood pressure reading- discussed low sodium diet/exercise. She does not have access to a bp cuff and I don't  think it is urgent for [place] to check her bp in the office given COVID risk. Will plan for a 3 month follow up hopefully face to face so that we can check her blood pressure.   Follow Up Instructions:    I discussed the assessment and treatment plan with the patient. The patient was provided an opportunity to ask questions and all were answered. The patient agreed with the plan and demonstrated an understanding of the instructions.   The patient was advised to call back or seek an in-person evaluation if the symptoms worsen or if the condition fails to improve as anticipated.    Lemont Fillers, NP

## 2019-02-11 ENCOUNTER — Telehealth: Payer: Self-pay

## 2019-02-11 NOTE — Telephone Encounter (Signed)
Copied from CRM [id number]. Topic: General - Other >> Feb 11, 2019 12:32 PM Marylen Ponto wrote: Reason for CRM: Felischa with [facility] stated they need pt mos recent labs to be faxed to them as pt is currently there for an appt. Fax# [phone]

## 2019-02-12 NOTE — Telephone Encounter (Signed)
Labs faxed

## 2019-03-25 ENCOUNTER — Ambulatory Visit: Payer: 59 | Admitting: Registered"

## 2019-04-08 ENCOUNTER — Other Ambulatory Visit: Payer: Self-pay

## 2019-04-08 ENCOUNTER — Encounter: Payer: 59 | Attending: Family | Admitting: Registered"

## 2019-04-08 ENCOUNTER — Encounter: Payer: Self-pay | Admitting: Registered"

## 2019-04-08 DIAGNOSIS — E669 Obesity, unspecified: Secondary | ICD-10-CM | POA: Diagnosis not present

## 2019-04-08 NOTE — Progress Notes (Signed)
Medical Nutrition Therapy:  Appt start time: 9:05 end time:  10:28.   Assessment:  Primary concerns today: Pt arrives today stating she has recently had higher blood pressure readings. Reports Mom passed away from complications with diabetes and sister was recently diagnosed with prediabetes. States she wants to prevent these issues.   Pt expectations: more knowledgeable about portion sizes, what to eat/not to eat, where to start  Pt states she drinks coffee in the morning to curb appetite until about 10-11am. States she cannot control her dinner at all and has sugar cravings in the evening. Reports she eats a lot of fast food due to working 2 jobs; easier for her to stop and grab food. States she tries to fight against having ice cream and sugar cravings at night. States she went to bed last night hungry and that was the reason for her going to bed when she did. States she learned to not eat throughout the night by placing water beside her bed to sip on at night.   Pt states she was tracking calories and wants to track them again. Reports she looked up calories in cookout milkshakes and has not had a milkshake since. Felt disgusted after having it although she likes them. States her weaknesses are plain ruffles chips and ice cream. States she is more of a cold [place] person and that will help the cravings will go away.   States she wants to lose 30 lbs to be more active, comfortable with image, and more comfortable with health. Feels like its an achievable goal.   States she used to skip breakfast when she was going to work and walking for 30 min. But since working for home it has been challenging to have a schedule and structure.    Preferred Learning Style:   No preference indicated   Learning Readiness:   Contemplating  Ready  Change in progress   MEDICATIONS: See list   DIETARY INTAKE:  Usual eating pattern includes 3 meals and 3 snacks per day.  Everyday foods include fast  food.  Avoided foods include none stated but tries to avoid bread at times.     24-hr recall:  B ( AM): coffee + peach Snk (10-11 AM): eggs + [place] bacon  Snk: 4 PB crackers L ( PM): salad or 1.5 corn dog Snk ( PM): none D ( PM): [employer]-spicy chicken sandwich + 1/2 med fries + diet Dr. Malachi Bonds Snk ( PM): plum Beverages: coffee, diet soda (20 oz), water (16 oz)  Usual physical activity: walking 30 min, 2-4 days/week  Estimated energy needs: 2000 calories 225 g carbohydrates 150 g protein 56 g fat  Progress Towards Goal(s):  In progress.   Nutritional Diagnosis:  NB-1.1 Food and nutrition-related knowledge deficit As related to mild hypertension.  As evidenced by pt verbalizes incomplete understanding.    Intervention:  Nutrition education and counseling. Pt was educated and counseled on the Health at Every Size, diet culture, balanced meals, purpose of eating a variety of food groups, meal planning, and importance of physical activity. Discussed dieting, eating to nourish the body, and what happens when we're inadequately nourished. Discussed protein vs carbohydrates vs non-starchy vegetables. Pt was in agreement with goals listed.  Goals: - Increase fiber intake by adding non-starchy vegetables to lunch and dinner.  - Aim to have balanced meals by having protein, non-starchy vegetables, and carbohydrates.  - Continue to keep up the great work with physical activity.  - Plan meals ahead of  time. Use handout.   Teaching Method Utilized:  Visual Auditory Hands on  Handouts given during visit include:  Meal Ideas  Barriers to learning/adherence to lifestyle change: contemplative stage of change  Demonstrated degree of understanding via:  Teach Back   Monitoring/Evaluation:  Dietary intake, exercise, and body weight prn.

## 2019-04-08 NOTE — Patient Instructions (Addendum)
-   Increase fiber intake by adding non-starchy vegetables to lunch and dinner.   - Aim to have balanced meals by having protein, non-starchy vegetables, and carbohydrates.   - Continue to keep up the great work with physical activity.   - Plan meals ahead of time. Use handout.

## 2019-05-02 ENCOUNTER — Telehealth: Payer: Self-pay

## 2019-05-02 NOTE — Telephone Encounter (Signed)
Patient is calling back stating that she never received a call back.  Patient was advised that letters/forms there is a 5-7 business turn around time. Patient was advised that Melissa did receive the message to see if messages could assist her with her back pain.

## 2019-05-02 NOTE — Telephone Encounter (Signed)
Called patient back but no answer, lvm for her to be aware Lenna Sciara will working on Monday.

## 2019-05-02 NOTE — Telephone Encounter (Signed)
Copied from [place] [id number]. Topic: General - Inquiry >> May 02, 2019  8:06 AM Richardo Priest, NT wrote: Reason for CRM: Patient called in stating she would like to know if PCP can write letter stating she is in need of massages to assist with lower back pain. States they have helped her greatly before. She did PT before but didn't feel like it had much affect. Patient is wondering if she can have this documentation for her HFA. Please advise. Call back is [phone].

## 2019-05-05 ENCOUNTER — Encounter: Payer: Self-pay | Admitting: Family

## 2019-05-05 NOTE — Telephone Encounter (Signed)
Please advise pt that letter has been posted to her mychart account.

## 2019-05-05 NOTE — Telephone Encounter (Signed)
Patient advised letter was posted

## 2019-07-13 ENCOUNTER — Encounter (HOSPITAL_COMMUNITY): Payer: Self-pay | Admitting: *Deleted

## 2019-07-13 ENCOUNTER — Other Ambulatory Visit: Payer: Self-pay

## 2019-07-13 ENCOUNTER — Emergency Department (HOSPITAL_COMMUNITY)
Admission: EM | Admit: 2019-07-13 | Discharge: 2019-07-13 | Disposition: A | Discharge status: Home / Self Care | Payer: 59 | Attending: Emergency Medicine | Admitting: Emergency Medicine

## 2019-07-13 DIAGNOSIS — I10 Essential (primary) hypertension: Secondary | ICD-10-CM | POA: Diagnosis not present

## 2019-07-13 DIAGNOSIS — F419 Anxiety disorder, unspecified: Secondary | ICD-10-CM | POA: Diagnosis not present

## 2019-07-13 NOTE — Discharge Instructions (Signed)
Please get your blood pressure machine out of your house.  Checking your blood pressure while anxious and being anxious about the number will cause the number to go up higher.  Please follow up with your primary care doctor.    I have given you information about the Dash dietary eating plan which is dietary approaches to stopping hypertension.  Please make sure that you are eating and that you are eating a healthy well-balanced diet.  As we discussed anxiety can cause many physical symptoms.  If these symptoms that you develop are different you may always return to the emergency room for evaluation.  I would recommend that you get a counselor.  I have given you the general resource guide which you use, however you may also go through your insurance to find a counselor that way.

## 2019-07-13 NOTE — ED Provider Notes (Signed)
[facility] Provider Note   CSN: [id number] Arrival date & time: 07/13/19  [id number]     History   Chief Complaint Chief Complaint  Patient presents with  . Hypertension  . Anxiety    HPI Amy Francis is a 33 y.o. female with a past medical history of anxiety, who presents today for evaluation of a "panic attack" and hypertension.  She reports that she has been monitoring her blood pressure at home because it was questionably elevated in her doctor's office.  This started in February.  She states that she feels very anxious in general especially about taking her blood pressure.  Today her blood pressure at home was 160.  She denies any headaches, chest pain, shortness of breath, fevers, nausea, vomiting, or diarrhea.  She states that she feels like she is unable to calm down.  On further discussion she discloses that her mother died when she was [age].  She did not get any counseling after that.  She states that ultimately she is afraid that something will happen to her and leave her daughter in a similar situation.  She reports compliance with a low-sodium diet.  She has previously been prescribed "something" for her anxiety however never took it.  She reports that she checks her blood pressure multiple times a day and is very anxious about the numbers.     HPI  Past Medical History:  Diagnosis Date  . Anxiety     Patient Active Problem List   Diagnosis Date Noted  . Abdominal pain, epigastric 07/21/2016  . Carpal tunnel syndrome 10/14/2013  . Unspecified vitamin D deficiency 07/21/2013  . Anxiety state, unspecified 07/14/2013  . Paresthesia 07/14/2013  . Atypical chest pain 07/14/2013  . Obesity (BMI 30-39.9) 07/14/2013    Past Surgical History:  Procedure Laterality Date  . APPENDECTOMY    . CESAREAN SECTION    . CESAREAN SECTION  2011     OB History    Gravida  1   Para  1   Term  1   Preterm  0   AB  0   Living  1    SAB  0   TAB  0   Ectopic  0   Multiple  0   Live Births               Home Medications    Prior to Admission medications   Medication Sig Start Date End Date Taking? Authorizing Provider  meloxicam (MOBIC) 7.5 MG tablet Take 1 tablet (7.5 mg total) by mouth daily as needed for pain. Pt report PRN Patient not taking: Reported on 04/08/2019 11/20/18   Sandford Craze'Sullivan, Melissa, NP    Family History Family History  Problem Relation Age of Onset  . Diabetes Mother        deceased from DM complication  . Hyperlipidemia Father   . Cancer Other     Social History Social History   Tobacco Use  . Smoking status: Never Smoker  . Smokeless tobacco: Never Used  Substance Use Topics  . Alcohol use: No  . Drug use: No     Allergies   Patient has no known allergies.   Review of Systems Review of Systems  Constitutional: Negative for chills and fever.  HENT: Negative for congestion.   Eyes: Negative for visual disturbance.  Respiratory: Negative for cough, chest tightness and shortness of breath.   Cardiovascular: Negative for chest pain.  Gastrointestinal: Negative for abdominal  pain, diarrhea, nausea and vomiting.  Musculoskeletal: Negative for back pain.  Skin: Negative for color change.  Neurological: Negative for weakness and headaches.  Psychiatric/Behavioral: Negative for confusion. The patient is nervous/anxious.   All other systems reviewed and are negative.    Physical Exam Updated Vital Signs BP (!) 178/93 (BP Location: Left Arm)   Pulse 82   Temp 99 F (37.2 C) (Oral)   Resp 18   SpO2 100%   Physical Exam Vitals signs and nursing note reviewed.  Constitutional:      General: She is not in acute distress.    Appearance: She is well-developed. She is not diaphoretic.  HENT:     Head: Normocephalic and atraumatic.  Eyes:     General: No scleral icterus.       Right eye: No discharge.        Left eye: No discharge.     Conjunctiva/sclera:  Conjunctivae normal.  Neck:     Musculoskeletal: Normal range of motion.  Cardiovascular:     Rate and Rhythm: Normal rate and regular rhythm.     Pulses: Normal pulses.     Heart sounds: Normal heart sounds.  Pulmonary:     Effort: Pulmonary effort is normal. No respiratory distress.     Breath sounds: No stridor.  Abdominal:     General: There is no distension.  Musculoskeletal:        General: No deformity.  Skin:    General: Skin is warm and dry.  Neurological:     General: No focal deficit present.     Mental Status: She is alert.     Motor: No abnormal muscle tone.  Psychiatric:        Mood and Affect: Mood normal.     Comments: Anxious, intermittently tearful when discussing anxiety provoking topics.  No SI/HI/AVH.       ED Treatments / Results  Labs (all labs ordered are listed, but only abnormal results are displayed) Labs Reviewed - No data to display  EKG None  Radiology No results found.  Procedures Procedures (including critical care time)  Medications Ordered in ED Medications - No data to display   Initial Impression / Assessment and Plan / ED Course  I have reviewed the triage vital signs and the nursing notes.  Pertinent labs & imaging results that were available during my care of the patient were reviewed by me and considered in my medical decision making (see chart for details).       Patient presents today for evaluation of anxiety and high blood pressure.  She states that she has been frequently checking her blood pressure since February.  Today she checked it while she was feeling anxious and it was in the 160s causing her to come here for evaluation of her high blood pressure.  Aside from her anxiety she denies any chest pain, shortness of breath, headache, vision changes or other symptoms.  She reports that her anxiety is unchanged from her baseline and that she frequently has been having what she calls panic attacks.  She states that  normally when she checks her blood pressure at home it is in the 361W systolic.  She is hypertensive with a blood pressure of 178/93 however she also appears to be very anxious.  As she is not having any symptoms and is visibly anxious I suspect that her hypertension is situational and does not require additional work-up at this time.  I recommended PCP follow-up regarding her  blood pressure and for her to stop checking her blood pressure at home as this appears to cause significant stress and anxiety.  I also recommended that she seek counseling.  I do not think starting her on antihypertensives at this time would be appropriate especially as her blood pressure normally runs in the 130s systolic.  She is given information on mindfulness based stress reduction, counseling services in the area and anxiety.  Return precautions were discussed with patient who states their understanding.  At the time of discharge patient denied any unaddressed complaints or concerns.  Patient is agreeable for discharge home.   Final Clinical Impressions(s) / ED Diagnoses   Final diagnoses:  Anxiety  Hypertension, unspecified type    ED Discharge Orders    None       Norman Clay 07/14/19 0054    Wynetta Fines, MD 07/14/19 [phone]

## 2019-07-13 NOTE — ED Triage Notes (Signed)
Pt says has been monitoring her blood pressure because it has been elevated at her doctors office. Pt says that she became very anxious today about checking her blood pressure. BP today at home 160. Pt denies headaches, cp, shortness of breath or dizziness. She is tearful and anxious in triage "because of my blood pressure being high".

## 2019-07-14 ENCOUNTER — Ambulatory Visit (INDEPENDENT_AMBULATORY_CARE_PROVIDER_SITE_OTHER): Payer: 59 | Admitting: Family

## 2019-07-14 DIAGNOSIS — F419 Anxiety disorder, unspecified: Secondary | ICD-10-CM | POA: Diagnosis not present

## 2019-07-14 DIAGNOSIS — R030 Elevated blood-pressure reading, without diagnosis of hypertension: Secondary | ICD-10-CM

## 2019-07-14 MED ORDER — ESCITALOPRAM OXALATE 10 MG PO TABS: ORAL_TABLET | ORAL | 0 refills | Status: DC

## 2019-07-14 NOTE — Progress Notes (Signed)
Virtual Visit via Video Note  I connected with Amy Francis on 07/14/19 at  9:40 AM EDT by a video enabled telemedicine application and verified that I am speaking with the correct person using two identifiers.  Location: Patient: work Provider: home   I discussed the limitations of evaluation and management by telemedicine and the availability of in person appointments. The patient expressed understanding and agreed to proceed.  History of Present Illness:  Patient is a 33 yr old female who presents today to discuss anxiety.  She reports that she recently started monitoring her blood pressure at home.  She thinks that she started monitoring it out of fear because her mother died when she was only 41 and she wants to take all precautions for her daughter.  She reports that typically her blood pressure has been stable at home with a systolic in the high 299B to 130s range.  She noted that her blood pressure got as high as 716 systolic.  She was also quite anxious at that time.  She went to the emergency department for further evaluation.  She notes that her overall anxiety is worsened by stress at home with her daughter being home schooled due to [place], stress at work.  Reports that she has been having intermittent panic attacks.Feels like "everything" is contributing to her anxiety.    BP Readings from Last 3 Encounters:  07/13/19 (!) 178/93  11/20/18 128/90  10/03/18 (!) 146/84     GAD 7 : Generalized Anxiety Score 07/14/2019  Nervous, Anxious, on Edge 3  Control/stop worrying 3  Worry too much - different things 1  Trouble relaxing 1  Restless 1  Easily annoyed or irritable 1  Afraid - awful might happen 3  Total GAD 7 Score 13  Anxiety Difficulty Somewhat difficult      Observations/Objective:   Gen: Awake, alert, no acute distress Resp: Breathing is even and non-labored Psych: calm/pleasant demeanor Neuro: Alert and Oriented x 3, + facial symmetry, speech is  clear.   Assessment and Plan:  Anxiety- I suggested that she establish with a counselor.  I think this would be especially important to help her address concerns surrounding her mother's early death.  I gave her the contact number for [facility] to schedule an appointment.  Will initiate Lexapro 10 mg. I instructed pt to start 1/2 tablet once daily for 1 week and then increase to a full tablet once daily on week two as tolerated.  We discussed common side effects such as nausea, drowsiness and weight gain.  She is instructed to discontinue medication go directly to ED if this occurs.  Pt verbalizes understanding.  Plan follow up in 1 month to evaluate progress.    Elevated blood pressure reading-I have advised the patient to follow-up with [place] in 2 weeks in the office so that we can repeat her blood pressure in the office and follow-up on her anxiety.  Follow Up Instructions:    I discussed the assessment and treatment plan with the patient. The patient was provided an opportunity to ask questions and all were answered. The patient agreed with the plan and demonstrated an understanding of the instructions.   The patient was advised to call back or seek an in-person evaluation if the symptoms worsen or if the condition fails to improve as anticipated.  Nance Pear, NP

## 2019-07-16 ENCOUNTER — Ambulatory Visit: Payer: 59 | Admitting: Family

## 2019-08-05 ENCOUNTER — Telehealth: Payer: Self-pay | Admitting: Family

## 2019-08-05 ENCOUNTER — Ambulatory Visit: Payer: 59 | Admitting: Family

## 2019-08-05 NOTE — Telephone Encounter (Signed)
escitalopram (LEXAPRO) 10 MG tablet     Patient is requesting refill.     [facility] [employer], [place] [phone] (Phone) [phone] (Fax)

## 2019-08-06 NOTE — Telephone Encounter (Signed)
Patient was running out of medication sooner than expected, she noticed pharmacy gave her a partial amount. She will call them to get the additional pills. She has appointment for 08-13-2019

## 2019-08-11 ENCOUNTER — Other Ambulatory Visit: Payer: Self-pay

## 2019-08-12 ENCOUNTER — Ambulatory Visit: Payer: Self-pay | Admitting: Family

## 2019-08-19 ENCOUNTER — Ambulatory Visit: Payer: Self-pay | Admitting: Family

## 2019-08-26 ENCOUNTER — Ambulatory Visit: Payer: Managed Care, Other (non HMO) | Admitting: Family

## 2019-08-28 ENCOUNTER — Other Ambulatory Visit: Payer: Self-pay

## 2019-08-29 ENCOUNTER — Ambulatory Visit (INDEPENDENT_AMBULATORY_CARE_PROVIDER_SITE_OTHER): Payer: Managed Care, Other (non HMO) | Admitting: Family

## 2019-08-29 ENCOUNTER — Encounter: Payer: Self-pay | Admitting: Family

## 2019-08-29 VITALS — BP 152/95 | HR 91 | Temp 97.0°F | Resp 16 | Ht 65.0 in | Wt 226.0 lb

## 2019-08-29 DIAGNOSIS — F419 Anxiety disorder, unspecified: Secondary | ICD-10-CM | POA: Diagnosis not present

## 2019-08-29 DIAGNOSIS — I10 Essential (primary) hypertension: Secondary | ICD-10-CM | POA: Diagnosis not present

## 2019-08-29 MED ORDER — FLUOXETINE HCL 10 MG PO CAPS: 10.0000 mg | ORAL_CAPSULE | Freq: Every day | ORAL | 1 refills | Status: DC

## 2019-08-29 MED ORDER — AMLODIPINE BESYLATE 2.5 MG PO TABS: 2.5000 mg | ORAL_TABLET | Freq: Every day | ORAL | 1 refills | Status: DC

## 2019-08-29 NOTE — Patient Instructions (Signed)
Please stop lexapro, start prozac once daily at bedtime. Add amlodipine 2.5mg  once daily for blood pressure.

## 2019-08-29 NOTE — Progress Notes (Signed)
Subjective:    Patient ID: Amy Francis, female    DOB: 10-28-1985, 33 y.o.   MRN: [id number]  HPI  Patient is a 33 yr old female who presents today for follow up.  Anxiety- Last visit we added lexapro 10mg  and discussed having her establish with a counselor.  She started the medication then stopped for 2 weeks.  She reports that it made her sleep so she stopped. Did notice that her anxiety was better when she was taking the lexapro.    BP Readings from Last 3 Encounters:  08/29/19 (!) 147/83  07/13/19 (!) 178/93  11/20/18 128/90    Review of Systems See HPI  Past Medical History:  Diagnosis Date  . Anxiety      Social History   Socioeconomic History  . Marital status: Single    Spouse name: Not on file  . Number of children: Not on file  . Years of education: Not on file  . Highest education level: Not on file  Occupational History  . Not on file  Social Needs  . Financial resource strain: Not on file  . Food insecurity    Worry: Not on file    Inability: Not on file  . Transportation needs    Medical: Not on file    Non-medical: Not on file  Tobacco Use  . Smoking status: Never Smoker  . Smokeless tobacco: Never Used  Substance and Sexual Activity  . Alcohol use: No  . Drug use: No  . Sexual activity: Yes    Birth control/protection: Condom  Lifestyle  . Physical activity    Days per week: Not on file    Minutes per session: Not on file  . Stress: Not on file  Relationships  . Social 01/19/19 on phone: Not on file    Gets together: Not on file    Attends religious service: Not on file    Active member of club or organization: Not on file    Attends meetings of clubs or organizations: Not on file    Relationship status: Not on file  . Intimate partner violence    Fear of current or ex partner: Not on file    Emotionally abused: Not on file    Physically abused: Not on file    Forced sexual activity: Not on file  Other Topics  Concern  . Not on file  Social History Narrative   Parent educator- [employer]   Daughter- [name] born 2011   Completed bachelors degree   Single   She has a good support system       Past Surgical History:  Procedure Laterality Date  . APPENDECTOMY    . CESAREAN SECTION    . CESAREAN SECTION  2011    Family History  Problem Relation Age of Onset  . Diabetes Mother        deceased from DM complication  . Hyperlipidemia Father   . Cancer Other     No Known Allergies  Current Outpatient Medications on File Prior to Visit  Medication Sig Dispense Refill  . escitalopram (LEXAPRO) 10 MG tablet 1/2 tablet by mouth once daily for 1 week, then increase to full tab once daily on week two 30 tablet 0  . etonogestrel-ethinyl estradiol (NUVARING) 0.12-0.015 MG/24HR vaginal ring      No current facility-administered medications on file prior to visit.     BP (!) 152/95   Pulse 91  Temp (!) 97 F (36.1 C) (Temporal)   Resp 16   Ht 5\' 5"  (1.651 m)   Wt 226 lb (102.5 kg)   SpO2 100%   BMI 37.61 kg/m       Objective:   Physical Exam Constitutional:      Appearance: She is well-developed.  Neck:     Musculoskeletal: Neck supple.     Thyroid: No thyromegaly.  Cardiovascular:     Rate and Rhythm: Normal rate and regular rhythm.     Heart sounds: Normal heart sounds. No murmur.  Pulmonary:     Effort: Pulmonary effort is normal. No respiratory distress.     Breath sounds: Normal breath sounds. No wheezing.  Skin:    General: Skin is warm and dry.  Neurological:     Mental Status: She is alert and oriented to person, place, and time.  Psychiatric:        Behavior: Behavior normal.        Thought Content: Thought content normal.        Judgment: Judgment normal.           Assessment & Plan:  Anxiety- uncontrolled. Pt did not tolerate lexapro. Will give trial of prozac.    HTN- new diagnosis for pt. Will add amlodipine 2.5mg  once daily.    A total of 25  minutes were spent face-to-face with the patient during this encounter and over half of that time was spent on counseling and coordination of care. The patient was counseled on anxiety treatment, hypertension and risks of uncontrolled HTN, med management of HTN.

## 2019-10-15 ENCOUNTER — Ambulatory Visit (INDEPENDENT_AMBULATORY_CARE_PROVIDER_SITE_OTHER): Payer: Managed Care, Other (non HMO) | Admitting: Bariatrics

## 2019-10-27 ENCOUNTER — Ambulatory Visit (INDEPENDENT_AMBULATORY_CARE_PROVIDER_SITE_OTHER): Payer: Managed Care, Other (non HMO) | Admitting: Bariatrics

## 2019-10-29 ENCOUNTER — Ambulatory Visit (INDEPENDENT_AMBULATORY_CARE_PROVIDER_SITE_OTHER): Payer: Managed Care, Other (non HMO) | Admitting: Bariatrics

## 2019-11-10 ENCOUNTER — Ambulatory Visit (INDEPENDENT_AMBULATORY_CARE_PROVIDER_SITE_OTHER): Payer: Managed Care, Other (non HMO) | Admitting: Bariatrics

## 2020-03-10 ENCOUNTER — Telehealth: Payer: Self-pay | Admitting: Family

## 2020-03-10 NOTE — Telephone Encounter (Signed)
Medication: escitalopram (LEXAPRO) 10 MG tablet  Has the patient contacted their pharmacy? No. (If no, request that the patient contact the pharmacy for the refill.) (If yes, when and what did the pharmacy advise?)  Preferred Pharmacy (with phone number or street name): [id number] [place], [place] - [address] AT [facility] OF [facility] & [address]  [address] [place] [address]  Phone:  [phone] Fax:  [phone]  DEA #:  [id number]  Agent: Please be advised that RX refills may take up to 3 business days. We ask that you follow-up with your pharmacy.

## 2020-03-12 ENCOUNTER — Telehealth (INDEPENDENT_AMBULATORY_CARE_PROVIDER_SITE_OTHER): Payer: 59 | Admitting: Family

## 2020-03-12 ENCOUNTER — Other Ambulatory Visit: Payer: Self-pay

## 2020-03-12 DIAGNOSIS — F419 Anxiety disorder, unspecified: Secondary | ICD-10-CM | POA: Diagnosis not present

## 2020-03-12 DIAGNOSIS — I10 Essential (primary) hypertension: Secondary | ICD-10-CM

## 2020-03-12 MED ORDER — AMLODIPINE BESYLATE 2.5 MG PO TABS: 2.5000 mg | ORAL_TABLET | Freq: Every day | ORAL | 1 refills | Status: DC

## 2020-03-12 MED ORDER — FLUOXETINE HCL 10 MG PO CAPS: 10.0000 mg | ORAL_CAPSULE | Freq: Every day | ORAL | 1 refills | Status: DC

## 2020-03-12 NOTE — Telephone Encounter (Signed)
Patient was seen today.

## 2020-03-12 NOTE — Progress Notes (Signed)
Virtual Visit via Video Note  I connected with Amy Francis on 03/12/20 at 10:40 AM EDT by a video enabled telemedicine application and verified that I am speaking with the correct person using two identifiers.  Location: Patient: home Provider: work   I discussed the limitations of evaluation and management by telemedicine and the availability of in person appointments. The patient expressed understanding and agreed to proceed. Only the patient and myself were present for today's video call.   History of Present Illness:  HTN- back in November we started amlodipine 2.5 mg.  Rare headaches. Not taking amlodipine regularly.   Anxiety- did not continue lexapro.   Reports that she some anxiety when she feels overwhelmed, "any little thing." She will have to stop and take a walk and go outside.  Always fidgeting.  Reports difficulty focusing. Trouble completing things. This really bothers her.    Past Medical History:  Diagnosis Date  . Anxiety      Social History   Socioeconomic History  . Marital status: Single    Spouse name: Not on file  . Number of children: Not on file  . Years of education: Not on file  . Highest education level: Not on file  Occupational History  . Not on file  Tobacco Use  . Smoking status: Never Smoker  . Smokeless tobacco: Never Used  Substance and Sexual Activity  . Alcohol use: No  . Drug use: No  . Sexual activity: Yes    Birth control/protection: Condom  Other Topics Concern  . 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 Difficulty of Paying Living Expenses:   Food Insecurity:   . Worried About Charity fundraiser in the Last Year:   . Arboriculturist in the Last Year:   Transportation Needs:   . Film/video editor  (Medical):   Marland Kitchen Lack of Transportation (Non-Medical):   Physical Activity:   . Days of Exercise per Week:   . Minutes of Exercise per Session:   Stress:   . Feeling of Stress :   Social Connections:   . Frequency of Communication with Friends and Family:   . Frequency of Social Gatherings with Friends and Family:   . Attends Religious Services:   . Active Member of Clubs or Organizations:   . Attends Archivist Meetings:   Marland Kitchen Marital Status:   Intimate Partner Violence:   . Fear of Current or Ex-Partner:   . Emotionally Abused:   Marland Kitchen Physically Abused:   . Sexually Abused:     Past Surgical History:  Procedure Laterality Date  . APPENDECTOMY    . CESAREAN SECTION    . CESAREAN SECTION  2011    Family History  Problem Relation Age of Onset  . Diabetes Mother        deceased from DM complication  . Hyperlipidemia Father   . Cancer Other     No Known Allergies  Current Outpatient Medications on File Prior to Visit  Medication Sig Dispense Refill  . amLODipine (NORVASC) 2.5 MG tablet Take 1 tablet (2.5 mg total) by mouth daily. 30 tablet 1  . escitalopram (LEXAPRO) 10 MG tablet 1/2 tablet by mouth once daily for  1 week, then increase to full tab once daily on week two 30 tablet 0  . etonogestrel-ethinyl estradiol (NUVARING) 0.12-0.015 MG/24HR vaginal ring     . FLUoxetine (PROZAC) 10 MG capsule Take 1 capsule (10 mg total) by mouth daily. 30 capsule 1   No current facility-administered medications on file prior to visit.    There were no vitals taken for this visit.      Observations/Objective:   Gen: Awake, alert, no acute distress Resp: Breathing is even and non-labored Psych: calm/pleasant demeanor Neuro: Alert and Oriented x 3, + facial symmetry, speech is clear.   Assessment and Plan:  Anxiety- uncontrolled. Will give trial of prozac. If her concentration symptoms fail to improve, consider referral for formal ADHD evaluation.  HTN-  Recommended  that she restart amlodipine. Plan follow up in 1 month for bp check and follow up on prozac.    Follow Up Instructions:    I discussed the assessment and treatment plan with the patient. The patient was provided an opportunity to ask questions and all were answered. The patient agreed with the plan and demonstrated an understanding of the instructions.   The patient was advised to call back or seek an in-person evaluation if the symptoms worsen or if the condition fails to improve as anticipated.  Lemont Fillers, NP

## 2020-03-15 ENCOUNTER — Other Ambulatory Visit: Payer: Self-pay

## 2020-03-15 ENCOUNTER — Encounter: Payer: Self-pay | Admitting: Family

## 2020-03-15 ENCOUNTER — Telehealth (INDEPENDENT_AMBULATORY_CARE_PROVIDER_SITE_OTHER): Payer: 59 | Admitting: Family

## 2020-03-15 DIAGNOSIS — J302 Other seasonal allergic rhinitis: Secondary | ICD-10-CM | POA: Diagnosis not present

## 2020-03-15 NOTE — Progress Notes (Signed)
Virtual Visit via Video Note  I connected with [name] on 03/15/20 at 12:00 PM EDT by a video enabled telemedicine application and verified that I am speaking with the correct person using two identifiers.  Location: Patient: home Provider: work   I discussed the limitations of evaluation and management by telemedicine and the availability of in person appointments. The patient expressed understanding and agreed to proceed. Only the patient and myself were present for today's video call.   History of Present Illness:  Patient is a 34 yr old female who presents today with chief complaint of clear nasal congestion.  Reports that symptoms started on Friday. Throat is itching, mild throat irritation, + sneezing, + mild cough.  She is using otc allergy medication with brief improvement in her symptoms. She denies fever, body aches or loss of taste or smell. + fatigue. She reports that she completed the moderna vaccine in April. No known covid exposure.   Observations/Objective:   Gen: Awake, alert, no acute distress ENT: slightly hoarse voice Resp: Breathing is even and non-labored Psych: calm/pleasant demeanor Neuro: Alert and Oriented x 3, + facial symmetry, speech is clear.   Assessment and Plan:  Seasonal allergies- low suspicion for covid.   Viral URI is a possibility, but I feel seasonal allergies are more likely. Suggested trial of Zyrtec D and prn tylenol. Pt is advised to call if new/worsening symptoms or if symptoms are not improved in 1 week. Pt verbalizes understanding.    Follow Up Instructions:    I discussed the assessment and treatment plan with the patient. The patient was provided an opportunity to ask questions and all were answered. The patient agreed with the plan and demonstrated an understanding of the instructions.   The patient was advised to call back or seek an in-person evaluation if the symptoms worsen or if the condition fails to improve as  anticipated.  Lemont Fillers, NP

## 2020-03-26 ENCOUNTER — Ambulatory Visit: Payer: 59 | Admitting: Family

## 2020-04-09 ENCOUNTER — Ambulatory Visit: Payer: 59 | Admitting: Family

## 2020-04-09 ENCOUNTER — Other Ambulatory Visit: Payer: Self-pay

## 2020-04-20 ENCOUNTER — Ambulatory Visit: Payer: 59 | Admitting: Family

## 2020-04-20 DIAGNOSIS — Z0289 Encounter for other administrative examinations: Secondary | ICD-10-CM

## 2020-04-21 ENCOUNTER — Telehealth: Payer: Self-pay | Admitting: Family

## 2020-04-21 NOTE — Telephone Encounter (Signed)
Medication: amLODipine (NORVASC) 2.5 MG tablet    Has the patient contacted their pharmacy? No. (If no, request that the patient contact the pharmacy for the refill.) (If yes, when and what did the pharmacy advise?)  Preferred Pharmacy (with phone number or street name): [facility][id number] [place] Otto, [place] - [address] AT [facility] OF [facility] & [address]  [address] [place] [address]  Phone:  [phone] Fax:  [phone]  DEA #:  [id number]  Agent: Please be advised that RX refills may take up to 3 business days. We ask that you follow-up with your pharmacy.

## 2020-04-22 ENCOUNTER — Other Ambulatory Visit: Payer: Self-pay

## 2020-04-22 MED ORDER — AMLODIPINE BESYLATE 2.5 MG PO TABS: 2.5000 mg | ORAL_TABLET | Freq: Every day | ORAL | 0 refills | Status: DC

## 2020-04-23 ENCOUNTER — Other Ambulatory Visit: Payer: Self-pay | Admitting: Family

## 2020-04-26 ENCOUNTER — Telehealth: Payer: Self-pay

## 2020-04-26 MED ORDER — AMLODIPINE BESYLATE 2.5 MG PO TABS: 2.5000 mg | ORAL_TABLET | Freq: Every day | ORAL | 0 refills | Status: DC

## 2020-04-26 NOTE — Telephone Encounter (Signed)
Nurse Assessment Nurse: Elroy Channel, RN, Rosey Bath Date/Time Lamount Cohen Time): 04/23/2020 6:21:34 PM Confirm and document reason for call. If symptomatic, describe symptoms. ---Caller states that her prescription for Amolodipine 2.4 QD is not at pharmacy. No symptoms. Advised caller that nurse could call in enough meds to last until 04/26/2020 Has the patient had close contact with a person known or suspected to have the novel coronavirus illness OR traveled / lives in area with major community spread (including international travel) in the last 14 days from the onset of symptoms? * If Asymptomatic, screen for exposure and travel within the last 14 days. ---No Does the patient have any new or worsening symptoms? ---No Please document clinical information provided and list any resource used. ---Julianne Handler that caller had given info to nurse. prescription was called in to another [facility] Disp. Time Lamount Cohen Time) Disposition Final User 04/23/2020 6:24:23 PM Clinical Call Yes Elroy Channel, RN, Rosey Bath Comments User: Criss Rosales, RN Date/Time Lamount Cohen Time): 04/23/2020 6:24:08 PM Informed caller that prescription was sent to [facility] on [address].

## 2020-04-30 ENCOUNTER — Ambulatory Visit: Payer: 59 | Admitting: Family

## 2020-04-30 ENCOUNTER — Other Ambulatory Visit: Payer: Self-pay

## 2020-04-30 VITALS — BP 148/84 | HR 122 | Temp 98.4°F | Resp 16 | Ht 65.0 in | Wt 228.0 lb

## 2020-04-30 DIAGNOSIS — E669 Obesity, unspecified: Secondary | ICD-10-CM | POA: Diagnosis not present

## 2020-04-30 DIAGNOSIS — I10 Essential (primary) hypertension: Secondary | ICD-10-CM

## 2020-04-30 MED ORDER — FLUOXETINE HCL 10 MG PO CAPS: 10.0000 mg | ORAL_CAPSULE | Freq: Every day | ORAL | 1 refills | Status: DC

## 2020-04-30 MED ORDER — AMLODIPINE BESYLATE 5 MG PO TABS
5.0000 mg | ORAL_TABLET | Freq: Every day | ORAL | 3 refills | Status: DC
Start: 2020-04-30 — End: 2020-10-20

## 2020-04-30 NOTE — Patient Instructions (Signed)
Please increase amlodipine to 5mg once daily.  ?

## 2020-04-30 NOTE — Progress Notes (Signed)
Subjective:    Patient ID: Amy Francis, female    DOB: 12/22/1985, 34 y.o.   MRN: [id number]  HPI  Patient is a 34 yr old female who presents today for follow up.  HTN- maintained on amlodipine 2.5mg  once daily.  BP Readings from Last 3 Encounters:  04/30/20 (!) 148/84  08/29/19 (!) 152/95  07/13/19 (!) 178/93   Anxiety- maintained on prozac 10mg .  She feels like it is helping. She is handling her handling her work much better.    Review of Systems    see HPI  Past Medical History:  Diagnosis Date  . Anxiety      Social History   Socioeconomic History  . Marital status: Single    Spouse name: Not on file  . Number of children: Not on file  . Years of education: Not on file  . Highest education level: Not on file  Occupational History  . Not on file  Tobacco Use  . Smoking status: Never Smoker  . Smokeless tobacco: Never Used  Substance and Sexual Activity  . Alcohol use: No  . Drug use: No  . Sexual activity: Yes    Birth control/protection: Condom  Other Topics Concern  . 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 Difficulty of Paying Living Expenses:   Food Insecurity:   . Worried About Corporate investment banker in the Last Year:   . Programme researcher, broadcasting/film/video in the Last Year:   Transportation Needs:   . Barista (Medical):   Freight forwarder Lack of Transportation (Non-Medical):   Physical Activity:   . Days of Exercise per Week:   . Minutes of Exercise per Session:   Stress:   . Feeling of Stress :   Social Connections:   . Frequency of Communication with Friends and Family:   . Frequency of Social Gatherings with Friends and Family:   . Attends Religious Services:   . Active Member of Clubs or Organizations:   . Attends Marland Kitchen  Meetings:   Banker Marital Status:   Intimate Partner Violence:   . Fear of Current or Ex-Partner:   . Emotionally Abused:   Marland Kitchen Physically Abused:   . Sexually Abused:     Past Surgical History:  Procedure Laterality Date  . APPENDECTOMY    . CESAREAN SECTION    . CESAREAN SECTION  2011    Family History  Problem Relation Age of Onset  . Diabetes Mother        deceased from DM complication  . Hyperlipidemia Father   . Cancer Other     No Known Allergies  Current Outpatient Medications on File Prior to Visit  Medication Sig Dispense Refill  . amLODipine (NORVASC) 2.5 MG tablet Take 1 tablet (2.5 mg total) by mouth daily. 30 tablet 0  . etonogestrel-ethinyl estradiol (NUVARING) 0.12-0.015 MG/24HR vaginal ring     . FLUoxetine (PROZAC) 10 MG capsule Take 1 capsule (10 mg total) by mouth daily. 30 capsule 1   No current facility-administered medications on file prior to visit.    BP (!) 148/84 (BP Location: Left Arm, Patient Position: Sitting, Cuff Size: Large)   Pulse (!) 122   Temp 98.4 F (  36.9 C) (Oral)   Resp 16   Ht 5\' 5"  (1.651 m)   Wt (!) 228 lb (103.4 kg)   SpO2 100%   BMI 37.94 kg/m    Objective:   Physical Exam Constitutional:      Appearance: She is well-developed.  Cardiovascular:     Rate and Rhythm: Normal rate and regular rhythm.     Heart sounds: Normal heart sounds. No murmur heard.   Pulmonary:     Effort: Pulmonary effort is normal. No respiratory distress.     Breath sounds: Normal breath sounds. No wheezing.  Psychiatric:        Behavior: Behavior normal.        Thought Content: Thought content normal.        Judgment: Judgment normal.           Assessment & Plan:  HTN-remains above goal. Had a long discussion with pt re: long term risks of uncontrolled hypertension. She was hesitant to increase her amlodipine dose.  She ultimately agreed to increase to 5mg  once daily.  She will continue to work on lifestyle modification.  Morbid  obesity- will refer to Medical Weight management for assistance with weight loss.  31 minutes spent on today's visit. The majority of this time was spent counseling visit.   This visit occurred during the SARS-CoV-2 public health emergency.  Safety protocols were in place, including screening questions prior to the visit, additional usage of staff PPE, and extensive cleaning of exam room while observing appropriate contact time as indicated for disinfecting solutions.

## 2020-05-01 IMAGING — DX DG LUMBAR SPINE COMPLETE 4+V
5 series · 5 of 5 positions shown · non-contrast
Comparison: None.

CLINICAL DATA: Intermittent low back pain for the past year.

EXAM:
LUMBAR SPINE - COMPLETE 4+ VIEW

[lumbar spine ap]
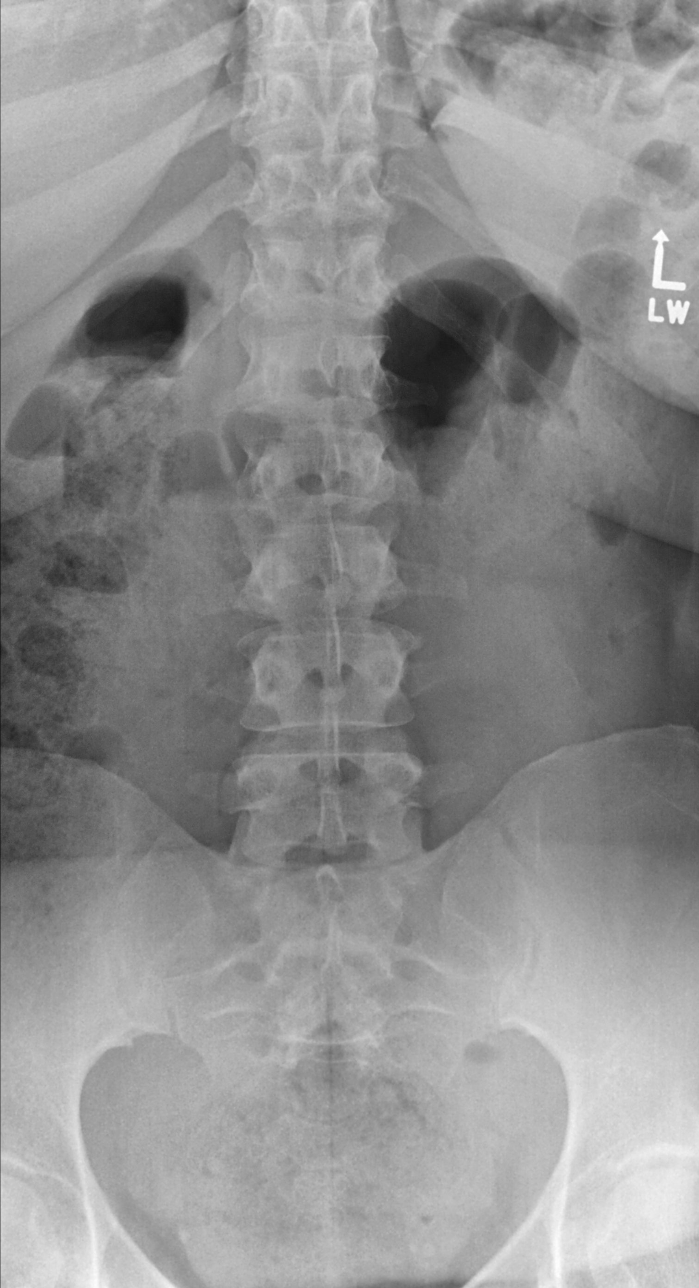

[lumbar spine obl (oblique) (1 of 2)]
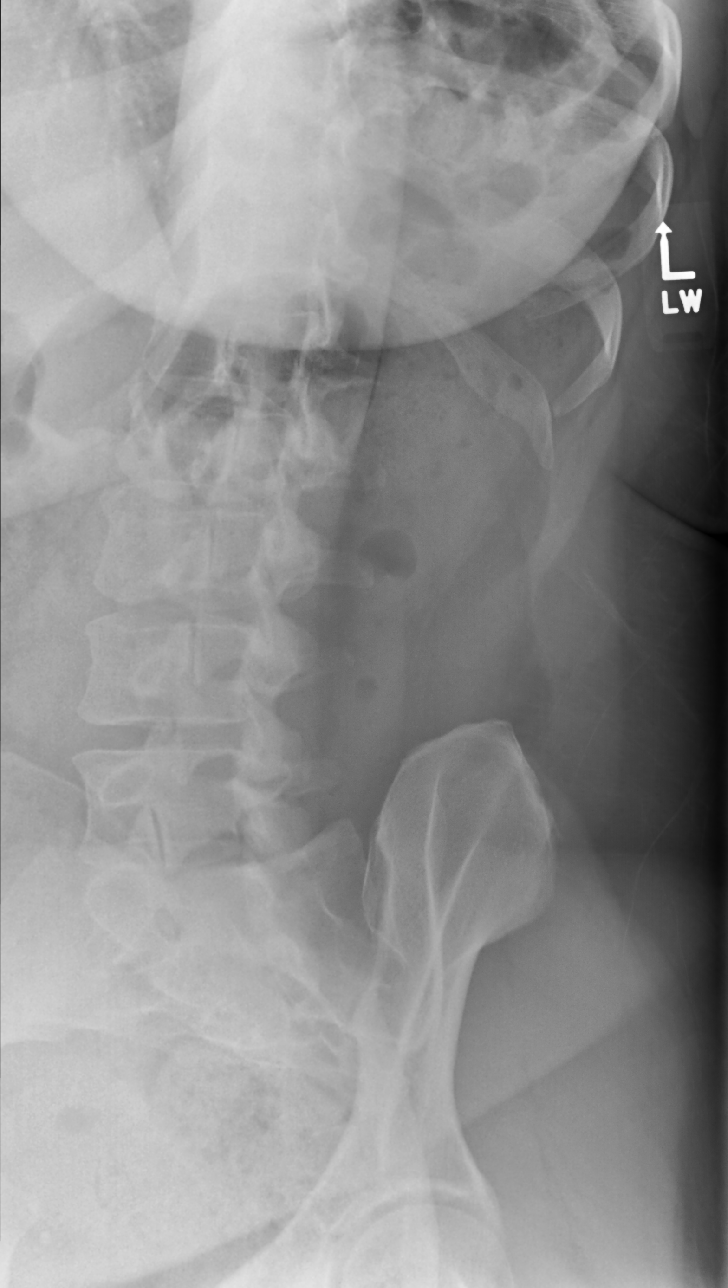

[lumbar spine obl (oblique) (2 of 2)]
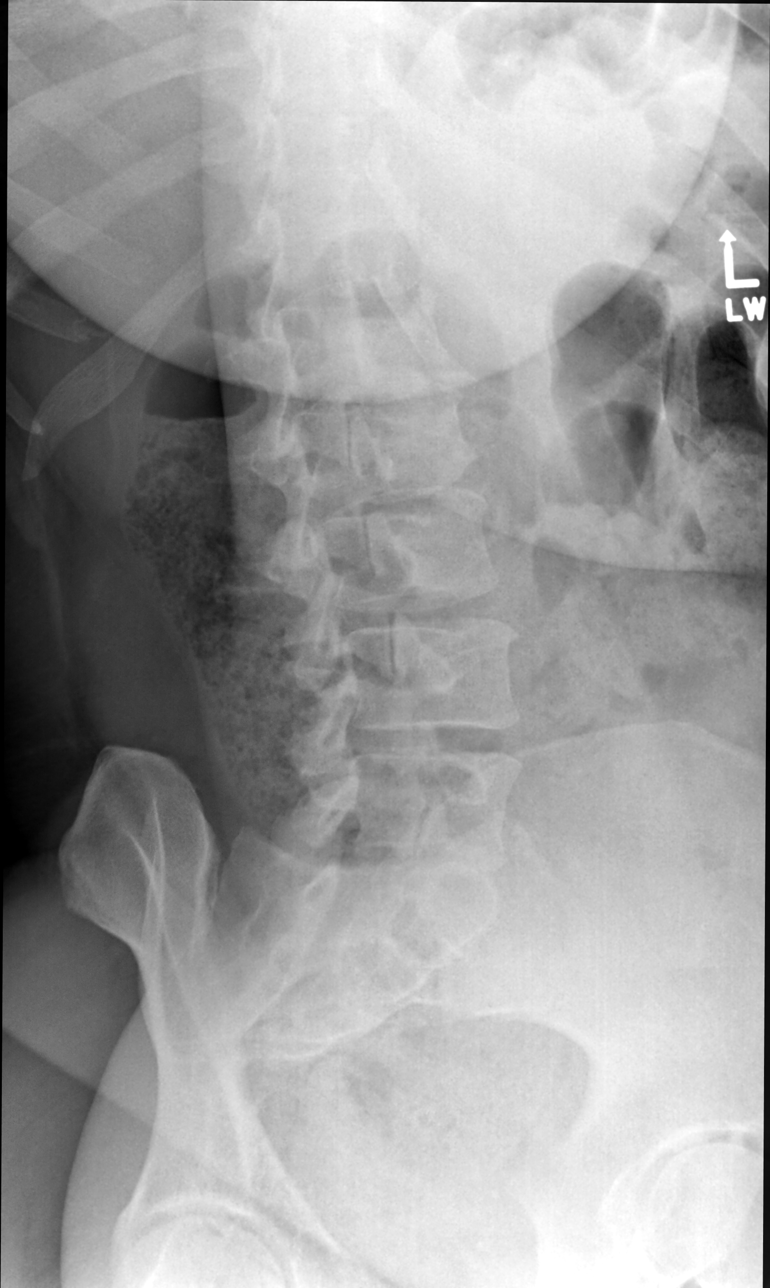

[lumbar spine lat]
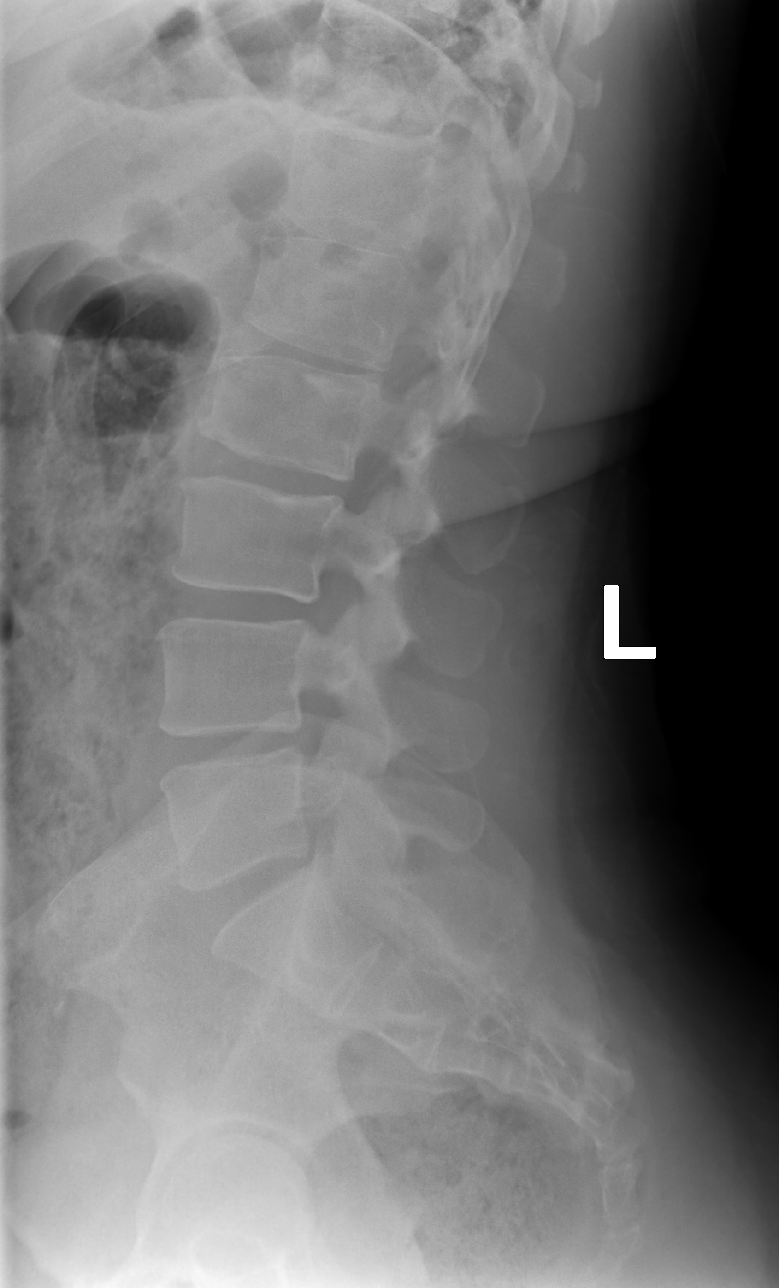

[lumbar spot lat]
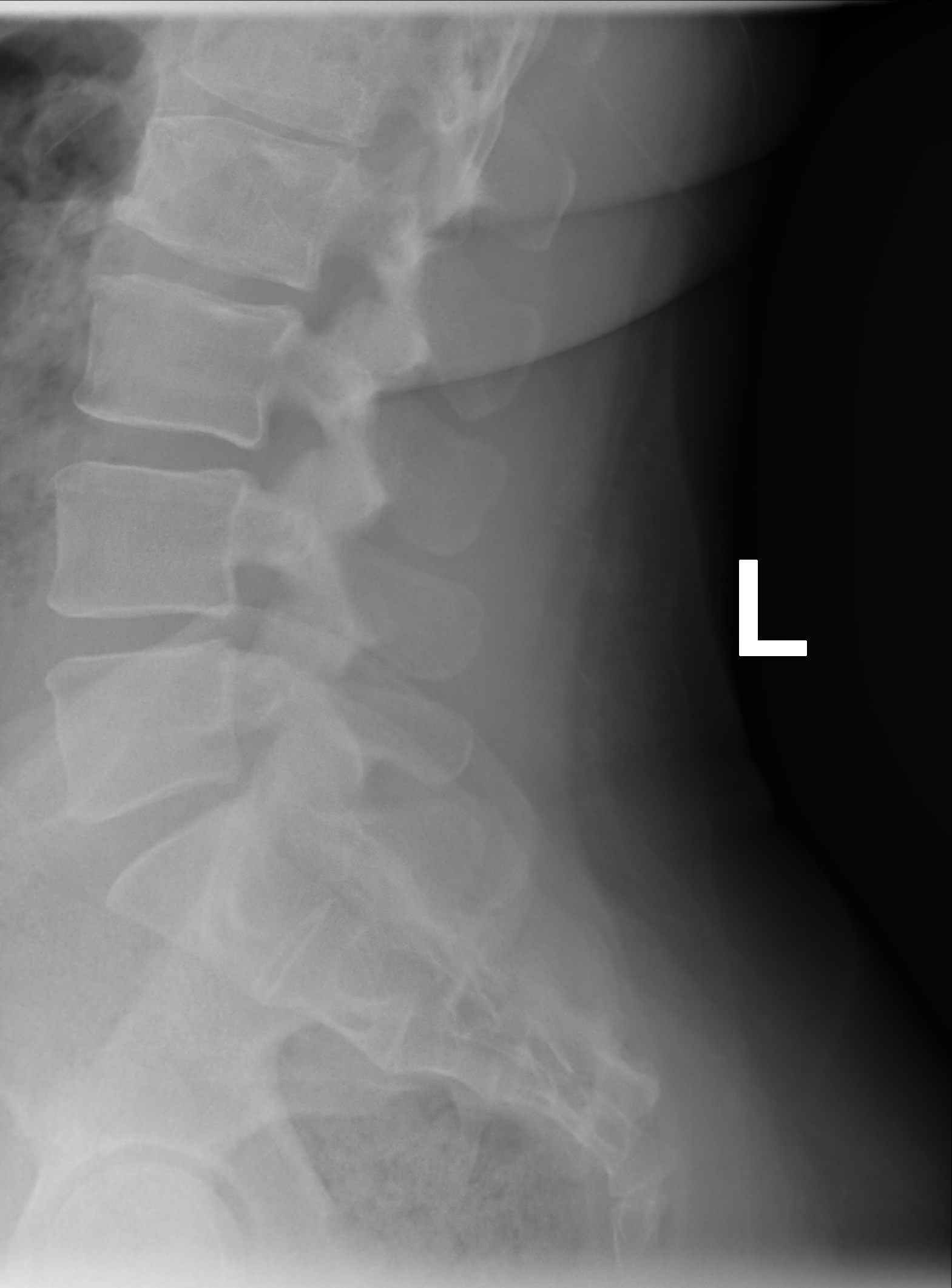

[5 of 5 positions shown; findings below may reference images not displayed]

FINDINGS: The lumbar vertebral bodies are preserved in height. The pedicles
and transverse processes are intact. There is moderate disc space
narrowing at L4-5. There is no spondylolisthesis. There is no
significant facet joint hypertrophy.
IMPRESSION: Moderate degenerative disc space narrowing at L4-5. The other disc
space heights are well maintained. No compression fracture or
spondylolisthesis.

## 2020-05-18 ENCOUNTER — Ambulatory Visit (INDEPENDENT_AMBULATORY_CARE_PROVIDER_SITE_OTHER): Payer: Managed Care, Other (non HMO) | Admitting: Family Medicine

## 2020-05-31 ENCOUNTER — Encounter (INDEPENDENT_AMBULATORY_CARE_PROVIDER_SITE_OTHER): Payer: Self-pay

## 2020-05-31 ENCOUNTER — Ambulatory Visit (INDEPENDENT_AMBULATORY_CARE_PROVIDER_SITE_OTHER): Payer: 59 | Admitting: Family Medicine

## 2020-06-01 ENCOUNTER — Ambulatory Visit (INDEPENDENT_AMBULATORY_CARE_PROVIDER_SITE_OTHER): Payer: Managed Care, Other (non HMO) | Admitting: Family Medicine

## 2020-06-15 ENCOUNTER — Ambulatory Visit (INDEPENDENT_AMBULATORY_CARE_PROVIDER_SITE_OTHER): Payer: 59 | Admitting: Family Medicine

## 2020-08-23 ENCOUNTER — Telehealth (INDEPENDENT_AMBULATORY_CARE_PROVIDER_SITE_OTHER): Payer: 59 | Admitting: Medical

## 2020-08-23 ENCOUNTER — Other Ambulatory Visit: Payer: Self-pay

## 2020-08-23 DIAGNOSIS — T7840XA Allergy, unspecified, initial encounter: Secondary | ICD-10-CM

## 2020-08-23 MED ORDER — PREDNISONE 10 MG (21) PO TBPK
ORAL_TABLET | ORAL | 0 refills | Status: DC
Start: 2020-08-23 — End: 2021-02-15

## 2020-08-23 MED ORDER — HYDROXYZINE HCL 10 MG PO TABS
ORAL_TABLET | ORAL | 0 refills | Status: DC
Start: 2020-08-23 — End: 2021-01-26

## 2020-08-23 NOTE — Progress Notes (Signed)
° °  Subjective:    Patient ID: Amy Francis, female    DOB: [birth year], 34 y.o.   MRN: [id number]  HPI  Virtual Visit via Video Note  I connected with Amy Francis on 08/23/20 at  3:40 PM EST by a video enabled telemedicine application and verified that I am speaking with the correct person using two identifiers.  Location: Patient: home Provider: office  Pt did not check bp or pulse.   I discussed the limitations of evaluation and management by telemedicine and the availability of in person appointments. The patient expressed understanding and agreed to proceed.  History of Present Illness:   Pt has rash that is all over. She states some raised areas. Rash on arms, legs, neck, back and abd. She states all over.  Pt tried new soap cetaphil and noticed rash around time she started. Pt also used new detergent gain. Used to use tide.  No new animals, no outside yard work or suspiciouous foods.  No sob, no wheezing, no tongue swelling or lip swelling.  Present for 2-3 days.  lmp- Jul 24, 2020. Pt is on contaceptive      Observations/Objective: General-no acute distress, pleasant, oriented. Lungs- on inspection lungs appear unlabored. Neck- no tracheal deviation or jvd on inspection. Neuro- gross motor function appears intact. Derm- video quality not good. Shadow on areas viewed. Only saw faint mild neck rash seen on left side.   Assessment and Plan: Allergic reaction described by history used of cetaphil lotion and gain detergent suspicious potential causes. Discontinue both. Recommend wash all clothes in former tide used in the past.  Rx prednisone taper course and hydoxyzine. Rx advisement given for both. Not to use benadryl while using hydroxyzine.  With upcoming potential cold and dry weather make sure keep self well moisturized.  Follow up in 5-7 days or as neede  Follow Up Instructions:    I discussed the assessment and treatment plan with the patient. The  patient was provided an opportunity to ask questions and all were answered. The patient agreed with the plan and demonstrated an understanding of the instructions.   The patient was advised to call back or seek an in-person evaluation if the symptoms worsen or if the condition fails to improve as anticipated.  I provided 20 minutes of non-face-to-face time during this encounter.   Esperanza Richters, PA-C   Review of Systems     Objective:   Physical Exam        Assessment & Plan:

## 2020-08-23 NOTE — Patient Instructions (Addendum)
Allergic reaction described by history used of cetaphil lotion and gain detergent suspicious potential causes. Discontinue both. Recommend wash all clothes in former tide used in the past.  Rx prednisone taper course and hydoxyzine. Rx advisement given for both. Not to use benadryl while using hydroxyzine.  With upcoming potential cold and dry weather make sure keep self well moisturized.  Follow up in 5-7 days or as neede

## 2020-10-20 ENCOUNTER — Telehealth: Payer: Self-pay | Admitting: Family

## 2020-10-20 MED ORDER — AMLODIPINE BESYLATE 5 MG PO TABS: 5.0000 mg | ORAL_TABLET | Freq: Every day | ORAL | 0 refills | Status: DC

## 2020-10-20 NOTE — Telephone Encounter (Signed)
Medication: amLODipine (NORVASC) 5 MG tablet [[id number]]       Has the patient contacted their pharmacy?  (If no, request that the patient contact the pharmacy for the refill.) (If yes, when and what did the pharmacy advise?)     Preferred Pharmacy (with phone number or street name): [facility] #[id number] Ginette Otto, [place] - [address] AT [facility] OF [facility] & [address]  [address] [place] [address]  Phone:  [phone] Fax:  [phone]      Agent: Please be advised that RX refills may take up to 3 business days. We ask that you follow-up with your pharmacy.

## 2020-10-20 NOTE — Telephone Encounter (Signed)
30 days supply sent, advised appt needed for past due assessment

## 2020-12-27 ENCOUNTER — Other Ambulatory Visit: Payer: Self-pay

## 2020-12-27 ENCOUNTER — Telehealth: Payer: Self-pay | Admitting: Family

## 2020-12-27 MED ORDER — AMLODIPINE BESYLATE 5 MG PO TABS: 5.0000 mg | ORAL_TABLET | Freq: Every day | ORAL | 0 refills | Status: DC

## 2020-12-27 NOTE — Telephone Encounter (Signed)
Patient reports she has been off the medication for a few days, 30 day refill sent and patient was scheduled to come back next week for follow up

## 2020-12-27 NOTE — Telephone Encounter (Signed)
Medication:amLODipine (NORVASC) 5 MG tablet [[id number]]       Has the patient contacted their pharmacy? no (If no, request that the patient contact the pharmacy for the refill.) (If yes, when and what did the pharmacy advise?)    Preferred Pharmacy (with phone number or street name): [facility] #[id number] Gin[place] Otto, [place] - [address] AT [facility] OF [facility] & [address] Phone:  [phone]  Fax:  [phone]          Agent: Please be advised that RX refills may take up to 3 business days. We ask that you follow-up with your pharmacy.

## 2020-12-28 ENCOUNTER — Ambulatory Visit: Payer: 59 | Admitting: Family

## 2021-01-04 ENCOUNTER — Ambulatory Visit: Payer: BC Managed Care – PPO | Admitting: Family

## 2021-01-26 ENCOUNTER — Encounter: Payer: Self-pay | Admitting: Family

## 2021-01-26 ENCOUNTER — Ambulatory Visit: Payer: BC Managed Care – PPO | Admitting: Family

## 2021-01-26 MED ORDER — HYDROXYZINE HCL 10 MG PO TABS: ORAL_TABLET | ORAL | 0 refills | Status: DC

## 2021-01-27 MED ORDER — FLUOXETINE HCL 10 MG PO CAPS: 10.0000 mg | ORAL_CAPSULE | Freq: Every day | ORAL | 0 refills | Status: DC

## 2021-02-04 ENCOUNTER — Ambulatory Visit: Payer: BC Managed Care – PPO | Admitting: Family

## 2021-02-08 ENCOUNTER — Ambulatory Visit: Payer: BC Managed Care – PPO | Admitting: Family

## 2021-02-15 ENCOUNTER — Telehealth (INDEPENDENT_AMBULATORY_CARE_PROVIDER_SITE_OTHER): Payer: BC Managed Care – PPO | Admitting: Family

## 2021-02-15 ENCOUNTER — Other Ambulatory Visit: Payer: Self-pay

## 2021-02-15 DIAGNOSIS — R41840 Attention and concentration deficit: Secondary | ICD-10-CM

## 2021-02-15 DIAGNOSIS — E669 Obesity, unspecified: Secondary | ICD-10-CM

## 2021-02-15 DIAGNOSIS — F411 Generalized anxiety disorder: Secondary | ICD-10-CM

## 2021-02-15 DIAGNOSIS — I10 Essential (primary) hypertension: Secondary | ICD-10-CM

## 2021-02-15 MED ORDER — FLUOXETINE HCL 10 MG PO CAPS: 10.0000 mg | ORAL_CAPSULE | Freq: Every day | ORAL | 0 refills | Status: DC

## 2021-02-15 MED ORDER — AMLODIPINE BESYLATE 5 MG PO TABS: 5.0000 mg | ORAL_TABLET | Freq: Every day | ORAL | 0 refills | Status: DC

## 2021-02-15 NOTE — Assessment & Plan Note (Addendum)
New. She is concerned about the possibility of ADHD.  Recommended that she complete formal ADHD assessment with psychology. Referral has been placed.

## 2021-02-15 NOTE — Assessment & Plan Note (Signed)
Uncontrolled. She felt better on prozac. Advised pt to retry prozac 10mg  once daily.

## 2021-02-15 NOTE — Assessment & Plan Note (Signed)
Control is unknown. Pt is advised to check her BP as soon as she is able and send me her readings via mychart. She is scheduled for cpx in 1 month as well. Continue amlodipine 5mg  once daily. Plan to adjust as needed.

## 2021-02-15 NOTE — Progress Notes (Signed)
MyChart Video Visit    Virtual Visit via Video Note   This visit type was conducted due to national recommendations for restrictions regarding the COVID-19 Pandemic (e.g. social distancing) in an effort to limit this patient's exposure and mitigate transmission in our community. This patient is at least at moderate risk for complications without adequate follow up. This format is felt to be most appropriate for this patient at this time. Physical exam was limited by quality of the video and audio technology used for the visit. CMA was able to get the patient set up on a video visit.  Patient location: Home Patient and provider in visit Provider location: Office  I discussed the limitations of evaluation and management by telemedicine and the availability of in person appointments. The patient expressed understanding and agreed to proceed.  Visit Date: 02/15/2021  Today's healthcare provider: Lemont Fillers, NP     Subjective:    Patient ID: Amy Francis, female    DOB: 1986-07-31, 35 y.o.   MRN: [id number]  No chief complaint on file.   HPI Patient is in today for a video visit.  She left her BP cuff at work. She has not checked her blood pressure in some time.   She reports that she is taking amlodipine 5mg  once daily. States her car didn't start this morning which is causing her stress.   Anxiety- reports that her anxiety has worsened.  Reports that she has trouble staying focused. Wonders if she has ADHD.    She has not weighed herself recently. She has been going to the GYM.  Wt Readings from Last 3 Encounters:  04/30/20 (!) 228 lb (103.4 kg)  08/29/19 226 lb (102.5 kg)  11/20/18 222 lb (100.7 kg)     Past Medical History:  Diagnosis Date  . Anxiety     Past Surgical History:  Procedure Laterality Date  . APPENDECTOMY    . CESAREAN SECTION    . CESAREAN SECTION  2011    Family History  Problem Relation Age of Onset  . Diabetes Mother         deceased from DM complication  . Hyperlipidemia Father   . Cancer Other     Social History   Socioeconomic History  . Marital status: Single    Spouse name: Not on file  . Number of children: Not on file  . Years of education: Not on file  . Highest education level: Not on file  Occupational History  . Not on file  Tobacco Use  . Smoking status: Never Smoker  . Smokeless tobacco: Never Used  Substance and Sexual Activity  . Alcohol use: No  . Drug use: No  . Sexual activity: Yes    Birth control/protection: Condom  Other Topics Concern  . 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 amLODipine (NORVASC) 5 MG tablet Take 1 tablet (5 mg total) by mouth daily.  Past due for assessment, please call our office asap for follow up in order to get additional refills or grater quantity. 30 tablet 0  . etonogestrel-ethinyl estradiol (NUVARING) 0.12-0.015 MG/24HR vaginal ring     . FLUoxetine (PROZAC) 10 MG capsule Take 1 capsule (10 mg total) by mouth daily. 30 capsule 0  . hydrOXYzine (ATARAX/VISTARIL) 10 MG tablet 1-2 tab po q 8 hours prn itching 30 tablet 0  . predniSONE (STERAPRED UNI-PAK 21 TAB) 10 MG (21) TBPK tablet Standard Taper over 6 days. 21 tablet 0   No facility-administered medications prior to visit.    No Known Allergies  ROS     Objective:    Physical Exam Constitutional:      Appearance: Normal appearance.  Neurological:     Mental Status: She is alert and oriented to  person, place, and time.  Psychiatric:        Mood and Affect: Affect normal.        Speech: Speech normal.        Behavior: Behavior normal.        Cognition and Memory: Cognition normal.     There were no vitals taken for this visit. Wt Readings from Last 3 Encounters:  04/30/20 (!) 228 lb (103.4 kg)  08/29/19 226 lb (102.5 kg)  11/20/18 222 lb (100.7 kg)    Diabetic Foot Exam - Simple   No data filed    Lab Results  Component Value Date   WBC 7.4 11/20/2018   HGB 13.9 11/20/2018   HCT 41.5 11/20/2018   PLT 272.0 11/20/2018   GLUCOSE 85 11/20/2018   CHOL 167 11/20/2018   TRIG 146.0 11/20/2018   HDL 50.10 11/20/2018   LDLCALC 87 11/20/2018   ALT 20 11/20/2018   AST 20 11/20/2018   NA 137 11/20/2018   K 4.3 11/20/2018   CL 102 11/20/2018   CREATININE 0.84 11/20/2018   BUN 14 11/20/2018   CO2 28 11/20/2018   TSH 0.93 11/20/2018   HGBA1C 5.8 01/03/2017    Lab Results  Component Value Date   TSH 0.93 11/20/2018   Lab Results  Component Value Date   WBC 7.4 11/20/2018   HGB 13.9 11/20/2018   HCT 41.5 11/20/2018   MCV 91.0 11/20/2018   PLT 272.0 11/20/2018   Lab Results  Component Value Date   NA 137 11/20/2018   K 4.3 11/20/2018   CO2 28 11/20/2018   GLUCOSE 85 11/20/2018   BUN 14 11/20/2018   CREATININE 0.84 11/20/2018   BILITOT 0.7 11/20/2018   ALKPHOS 45 11/20/2018   AST 20 11/20/2018   ALT 20 11/20/2018   PROT 7.2 11/20/2018   ALBUMIN 4.4 11/20/2018   CALCIUM 9.6 11/20/2018   ANIONGAP 9 03/27/2015   GFR 94.33 11/20/2018   Lab Results  Component Value Date   CHOL 167 11/20/2018   Lab Results  Component Value Date   HDL 50.10 11/20/2018   Lab Results  Component Value Date   LDLCALC 87 11/20/2018   Lab Results  Component Value Date   TRIG 146.0 11/20/2018   Lab Results  Component Value Date   CHOLHDL 3 11/20/2018   Lab Results  Component Value Date   HGBA1C 5.8 01/03/2017       Assessment & Plan:   Problem List Items  Addressed This Visit   None      No orders of the defined types were placed in this encounter.   I discussed the assessment and treatment  plan with the patient. The patient was provided an opportunity to ask questions and all were answered. The patient agreed with the plan and demonstrated an understanding of the instructions.   The patient was advised to call back or seek an in-person evaluation if the symptoms worsen or if the condition fails to improve as anticipated.  Lemont Fillers, NP [employer] [facility] [employer] [phone] (phone) [phone] (fax)  [facility]

## 2021-02-15 NOTE — Assessment & Plan Note (Signed)
She has started going to the GYM. Will plan to weigh her at her next visit.

## 2021-03-24 DIAGNOSIS — Z3201 Encounter for pregnancy test, result positive: Secondary | ICD-10-CM | POA: Diagnosis not present

## 2021-03-24 DIAGNOSIS — Z6839 Body mass index (BMI) 39.0-39.9, adult: Secondary | ICD-10-CM | POA: Diagnosis not present

## 2021-03-24 DIAGNOSIS — N925 Other specified irregular menstruation: Secondary | ICD-10-CM | POA: Diagnosis not present

## 2021-03-25 ENCOUNTER — Encounter: Payer: BC Managed Care – PPO | Admitting: Family

## 2021-03-25 NOTE — Progress Notes (Incomplete)
Subjective:   By signing my name below, I, Amy Francis, attest that this documentation has been prepared under the direction and in the presence of Sandford Craze NP. 03/25/2021     Patient ID: Amy Francis, female    DOB: [birth year], 35 y.o.   MRN: [id number]  No chief complaint on file.   HPI Patient is in today for a comprehensive physical exam.   Immunizations: Diet: Exercise: Pap Smear: Last completed 01/23/2018. Results normal. Repeat in 3 years(Due). Mammogram: Not yet completed. Dental: Vision:  Health Maintenance Due  Topic Date Due   COVID-19 Vaccine (1) Never done   HIV Screening  Never done   Hepatitis C Screening  Never done   PAP SMEAR-Modifier  01/23/2021    Past Medical History:  Diagnosis Date   Anxiety     Past Surgical History:  Procedure Laterality Date   APPENDECTOMY     CESAREAN SECTION     CESAREAN SECTION  2011    Family History  Problem Relation Age of Onset   Diabetes Mother        deceased from DM complication   Hyperlipidemia Father    Cancer Other     Social History   Socioeconomic History   Marital status: Single    Spouse name: Not on file   Number of children: Not on file   Years of education: Not on file   Highest education level: Not on file  Occupational History   Not on file  Tobacco Use   Smoking status: Never   Smokeless tobacco: Never  Substance and Sexual Activity   Alcohol use: No   Drug use: No   Sexual activity: Yes    Birth control/protection: Condom  Other Topics Concern   Not on file  Social History Narrative   Parent educator- Programme researcher, [employer]   Daughter- [name] born 2011   Completed bachelors degree   Single   She has a good support system      Social Determinants of Corporate investment banker Strain: Not on file  Food Insecurity: Not on file  Transportation Needs: Not on file  Physical Activity: Not on file  Stress: Not on file  Social Connections: Not on file   Intimate Partner Violence: Not on file    Outpatient Medications Prior to Visit  Medication Sig Dispense Refill   amLODipine (NORVASC) 5 MG tablet Take 1 tablet (5 mg total) by mouth daily. Past due for assessment, please call our office asap for follow up in order to get additional refills or grater quantity. 90 tablet 0   etonogestrel-ethinyl estradiol (NUVARING) 0.12-0.015 MG/24HR vaginal ring      FLUoxetine (PROZAC) 10 MG capsule Take 1 capsule (10 mg total) by mouth daily. 30 capsule 0   hydrOXYzine (ATARAX/VISTARIL) 10 MG tablet 1-2 tab po q 8 hours prn itching 30 tablet 0   No facility-administered medications prior to visit.    No Known Allergies  ROS     Objective:    Physical Exam Constitutional:      General: She is not in acute distress.    Appearance: Normal appearance. She is not ill-appearing.  HENT:     Head: Normocephalic and atraumatic.     Right Ear: Tympanic membrane and external ear normal.     Left Ear: Tympanic membrane and external ear normal.  Eyes:     Extraocular Movements: Extraocular movements intact.     Pupils: Pupils are equal, round, and  reactive to light.     Comments: No nystagmus  Cardiovascular:     Rate and Rhythm: Normal rate and regular rhythm.     Pulses: Normal pulses.     Heart sounds: Normal heart sounds. No murmur heard.   No gallop.  Pulmonary:     Effort: Pulmonary effort is normal. No respiratory distress.     Breath sounds: Normal breath sounds. No wheezing, rhonchi or rales.  Musculoskeletal:     Comments: 5/5 strength in both upper and lower extremities  Skin:    General: Skin is warm and dry.  Neurological:     Mental Status: She is alert and oriented to person, place, and time.     Deep Tendon Reflexes:     Reflex Scores:      Patellar reflexes are 2+ on the right side and 2+ on the left side. Psychiatric:        Behavior: Behavior normal.    There were no vitals taken for this visit. Wt Readings from Last 3  Encounters:  04/30/20 (!) 228 lb (103.4 kg)  08/29/19 226 lb (102.5 kg)  11/20/18 222 lb (100.7 kg)       Assessment & Plan:   Problem List Items Addressed This Visit   None    No orders of the defined types were placed in this encounter.   I, Sandford Craze NP, personally preformed the services described in this documentation.  All medical record entries made by the scribe were at my direction and in my presence.  I have reviewed the chart and discharge instructions (if applicable) and agree that the record reflects my personal performance and is accurate and complete. 03/25/2021   I,Amy Francis,acting as a Neurosurgeon for Lemont Fillers, NP.,have documented all relevant documentation on the behalf of Lemont Fillers, NP,as directed by  Lemont Fillers, NP while in the presence of Lemont Fillers, NP.   Amy [employer]

## 2021-03-31 DIAGNOSIS — N925 Other specified irregular menstruation: Secondary | ICD-10-CM | POA: Diagnosis not present

## 2021-04-04 DIAGNOSIS — Z6791 Unspecified blood type, Rh negative: Secondary | ICD-10-CM | POA: Diagnosis not present

## 2021-04-15 ENCOUNTER — Encounter: Payer: BC Managed Care – PPO | Admitting: Family

## 2021-04-27 DIAGNOSIS — N939 Abnormal uterine and vaginal bleeding, unspecified: Secondary | ICD-10-CM | POA: Diagnosis not present

## 2021-05-02 ENCOUNTER — Other Ambulatory Visit: Payer: Self-pay

## 2021-05-02 ENCOUNTER — Encounter (HOSPITAL_COMMUNITY): Payer: Self-pay | Admitting: Obstetrics and Gynecology

## 2021-05-02 NOTE — Progress Notes (Signed)
PCP - Sandford Craze, NP Cardiologist -  EKG -  Chest x-ray -  ECHO -  Cardiac Cath -  CPAP -    ERAS Protcol - n/a  COVID TEST- n/a  Anesthesia review: n/a  -------------  SDW INSTRUCTIONS:  Your procedure is scheduled on Tuesday 7/26. Please report to [facility] Main Entrance "A" at 1300 P.M., and check in at the Admitting office. Call this number if you have problems the morning of surgery: [phone]   Remember: Do not eat after midnight the night before your surgery  You may drink clear liquids until 12:30 the day of your surgery.   Clear liquids allowed are: Water, Non-Citrus Juices (without pulp), Carbonated Beverages, Clear Tea, Black Coffee Only, and Gatorade   Medications to take morning of surgery with a sip of water include: amLODipine (NORVASC)  FLUoxetine (PROZAC)   As of today, STOP taking any Aspirin (unless otherwise instructed by your surgeon), Aleve, Naproxen, Ibuprofen, Motrin, Advil, Goody's, BC's, all herbal medications, fish oil, and all vitamins.    The Morning of Surgery Do not wear jewelry, make-up or nail polish. Do not wear lotions, powders, or perfumes/colognes, or deodorant Do not shave 48 hours prior to surgery.   Do not bring valuables to the hospital. [facility] is not responsible for any belongings or valuables.  If you are a smoker, DO NOT Smoke 24 hours prior to surgery  If you wear a CPAP at night please bring your mask the morning of surgery   Remember that you must have someone to transport you home after your surgery, and remain with you for 24 hours if you are discharged the same day.  Please bring cases for contacts, glasses, hearing aids, dentures or bridgework because it cannot be worn into surgery.   Patients discharged the day of surgery will not be allowed to drive home.   Please shower the NIGHT BEFORE/MORNING OF SURGERY (use antibacterial soap like DIAL soap if possible). Wear comfortable clothes the morning  of surgery. Oral Hygiene is also important to reduce your risk of infection.  Remember - BRUSH YOUR TEETH THE MORNING OF SURGERY WITH YOUR REGULAR TOOTHPASTE  Patient denies shortness of breath, fever, cough and chest pain.

## 2021-05-02 NOTE — Progress Notes (Signed)
Left message for scheduler at Dr. Kittie Plater office.  Requested orders and asked for call back for clarification if patient is to be discharged same day.  Posted as outpatient in bed which would require covid test, but patient states she is going home same day and does not need test.

## 2021-05-03 ENCOUNTER — Ambulatory Visit (HOSPITAL_COMMUNITY)
Admission: RE | Admit: 2021-05-03 | Discharge: 2021-05-03 | Disposition: A | Discharge status: Home / Self Care | Payer: BC Managed Care – PPO | Attending: Obstetrics and Gynecology | Admitting: Obstetrics and Gynecology

## 2021-05-03 ENCOUNTER — Other Ambulatory Visit: Payer: Self-pay

## 2021-05-03 ENCOUNTER — Ambulatory Visit (HOSPITAL_COMMUNITY): Payer: BC Managed Care – PPO | Admitting: Anesthesiology

## 2021-05-03 ENCOUNTER — Encounter (HOSPITAL_COMMUNITY): Payer: Self-pay | Admitting: Obstetrics and Gynecology

## 2021-05-03 ENCOUNTER — Encounter (HOSPITAL_COMMUNITY)
Admission: RE | Disposition: A | Discharge status: Home / Self Care | Payer: Self-pay | Source: Home / Self Care | Attending: Obstetrics and Gynecology

## 2021-05-03 DIAGNOSIS — O99280 Endocrine, nutritional and metabolic diseases complicating pregnancy, unspecified trimester: Secondary | ICD-10-CM | POA: Diagnosis not present

## 2021-05-03 DIAGNOSIS — I10 Essential (primary) hypertension: Secondary | ICD-10-CM | POA: Diagnosis not present

## 2021-05-03 DIAGNOSIS — O021 Missed abortion: Secondary | ICD-10-CM | POA: Diagnosis not present

## 2021-05-03 DIAGNOSIS — O039 Complete or unspecified spontaneous abortion without complication: Secondary | ICD-10-CM | POA: Diagnosis not present

## 2021-05-03 DIAGNOSIS — E559 Vitamin D deficiency, unspecified: Secondary | ICD-10-CM | POA: Diagnosis not present

## 2021-05-03 DIAGNOSIS — Z6841 Body Mass Index (BMI) 40.0 and over, adult: Secondary | ICD-10-CM | POA: Diagnosis not present

## 2021-05-03 DIAGNOSIS — Z79899 Other long term (current) drug therapy: Secondary | ICD-10-CM | POA: Insufficient documentation

## 2021-05-03 DIAGNOSIS — E669 Obesity, unspecified: Secondary | ICD-10-CM | POA: Diagnosis not present

## 2021-05-03 DIAGNOSIS — O169 Unspecified maternal hypertension, unspecified trimester: Secondary | ICD-10-CM | POA: Diagnosis not present

## 2021-05-03 HISTORY — PX: DILATION AND EVACUATION: SHX1459

## 2021-05-03 HISTORY — DX: Essential (primary) hypertension: I10

## 2021-05-03 LAB — TYPE AND SCREEN
ABO/RH(D): O NEG
Antibody Screen: POSITIVE

## 2021-05-03 LAB — CBC
HCT: 43 % (ref 36.0–46.0)
Hemoglobin: 14.2 g/dL (ref 12.0–15.0)
MCH: 30.7 pg (ref 26.0–34.0)
MCHC: 33 g/dL (ref 30.0–36.0)
MCV: 92.9 fL (ref 80.0–100.0)
Platelets: 336 10*3/uL (ref 150–400)
RBC: 4.63 MIL/uL (ref 3.87–5.11)
RDW: 12.8 % (ref 11.5–15.5)
WBC: 8.9 10*3/uL (ref 4.0–10.5)
nRBC: 0 % (ref 0.0–0.2)

## 2021-05-03 LAB — BASIC METABOLIC PANEL
Anion gap: 6 (ref 5–15)
BUN: 7 mg/dL (ref 6–20)
CO2: 26 mmol/L (ref 22–32)
Calcium: 9.4 mg/dL (ref 8.9–10.3)
Chloride: 103 mmol/L (ref 98–111)
Creatinine, Ser: 0.84 mg/dL (ref 0.44–1.00)
GFR, Estimated: >60 mL/min (ref >60)
Glucose, Bld: 98 mg/dL (ref 70–99)
Potassium: 3.9 mmol/L (ref 3.5–5.1)
Sodium: 135 mmol/L (ref 135–145)

## 2021-05-03 LAB — ABO/RH: ABO/RH(D): O NEG

## 2021-05-03 SURGERY — DILATION AND EVACUATION, UTERUS
Anesthesia: Monitor Anesthesia Care

## 2021-05-03 MED ORDER — PROMETHAZINE HCL 25 MG/ML IJ SOLN: 6.2500 mg | INTRAMUSCULAR | Status: DC | PRN

## 2021-05-03 MED ORDER — FENTANYL CITRATE (PF) 100 MCG/2ML IJ SOLN: 25.0000 ug | INTRAMUSCULAR | Status: DC | PRN

## 2021-05-03 MED ORDER — PROPOFOL 10 MG/ML IV BOLUS
INTRAVENOUS | Status: DC | PRN
  Administered 2021-05-03: 50 mg via INTRAVENOUS

## 2021-05-03 MED ORDER — OXYCODONE HCL 5 MG/5ML PO SOLN: 5.0000 mg | Freq: Once | ORAL | Status: DC | PRN

## 2021-05-03 MED ORDER — KETOROLAC TROMETHAMINE 15 MG/ML IJ SOLN
15.0000 mg | INTRAMUSCULAR | Status: AC
  Administered 2021-05-03: 15 mg via INTRAVENOUS
  Filled 2021-05-03: qty 1

## 2021-05-03 MED ORDER — MIDAZOLAM HCL 2 MG/2ML IJ SOLN
INTRAMUSCULAR | Status: AC
  Filled 2021-05-03: qty 2

## 2021-05-03 MED ORDER — MIDAZOLAM HCL 5 MG/5ML IJ SOLN
INTRAMUSCULAR | Status: DC | PRN
  Administered 2021-05-03: 2 mg via INTRAVENOUS

## 2021-05-03 MED ORDER — METHYLERGONOVINE MALEATE 0.2 MG/ML IJ SOLN
INTRAMUSCULAR | Status: AC
  Filled 2021-05-03: qty 1

## 2021-05-03 MED ORDER — METHYLERGONOVINE MALEATE 0.2 MG/ML IJ SOLN
INTRAMUSCULAR | Status: DC | PRN
  Administered 2021-05-03: .2 mg via INTRAMUSCULAR

## 2021-05-03 MED ORDER — OXYCODONE HCL 5 MG PO TABS: 5.0000 mg | ORAL_TABLET | Freq: Once | ORAL | Status: DC | PRN

## 2021-05-03 MED ORDER — PROPOFOL 500 MG/50ML IV EMUL
INTRAVENOUS | Status: DC | PRN
  Administered 2021-05-03: 200 ug/kg/min via INTRAVENOUS

## 2021-05-03 MED ORDER — FENTANYL CITRATE (PF) 100 MCG/2ML IJ SOLN
INTRAMUSCULAR | Status: AC
  Filled 2021-05-03: qty 2

## 2021-05-03 MED ORDER — ACETAMINOPHEN 500 MG PO TABS
1000.0000 mg | ORAL_TABLET | ORAL | Status: AC
  Filled 2021-05-03: qty 2

## 2021-05-03 MED ORDER — CHLORHEXIDINE GLUCONATE 0.12 % MT SOLN
OROMUCOSAL | Status: AC
  Administered 2021-05-03: 15 mL
  Filled 2021-05-03: qty 15

## 2021-05-03 MED ORDER — ONDANSETRON HCL 4 MG/2ML IJ SOLN
INTRAMUSCULAR | Status: DC | PRN
  Administered 2021-05-03: 4 mg via INTRAVENOUS

## 2021-05-03 MED ORDER — FENTANYL CITRATE (PF) 100 MCG/2ML IJ SOLN
INTRAMUSCULAR | Status: DC | PRN
  Administered 2021-05-03: 100 ug via INTRAVENOUS

## 2021-05-03 MED ORDER — DEXTROSE 5 % IV SOLN
200.0000 mg | Freq: Once | INTRAVENOUS | Status: AC
Start: 2021-05-03 — End: 2021-05-03
  Administered 2021-05-03: 200 mg via INTRAVENOUS
  Filled 2021-05-03: qty 200

## 2021-05-03 MED ORDER — LACTATED RINGERS IV SOLN: INTRAVENOUS | Status: DC | PRN

## 2021-05-03 MED ORDER — POVIDONE-IODINE 10 % EX SWAB
2.0000 "application " | Freq: Once | CUTANEOUS | Status: AC
  Administered 2021-05-03: 2 via TOPICAL

## 2021-05-03 MED ORDER — DROPERIDOL 2.5 MG/ML IJ SOLN: 0.6250 mg | Freq: Once | INTRAMUSCULAR | Status: DC | PRN

## 2021-05-03 MED ORDER — ACETAMINOPHEN 500 MG PO TABS
ORAL_TABLET | ORAL | Status: AC
  Administered 2021-05-03: 1000 mg
  Filled 2021-05-03: qty 1

## 2021-05-03 SURGICAL SUPPLY — 20 items
CATH ROBINSON RED A/P 16FR (CATHETERS) ×2 IMPLANT
DECANTER SPIKE VIAL GLASS SM (MISCELLANEOUS) ×2 IMPLANT
FILTER UTR ASPR ASSEMBLY (MISCELLANEOUS) ×2 IMPLANT
GLOVE SURG ENC MOIS LTX SZ7 (GLOVE) ×2 IMPLANT
GLOVE SURG UNDER POLY LF SZ7 (GLOVE) ×2 IMPLANT
GOWN STRL REUS W/ TWL LRG LVL3 (GOWN DISPOSABLE) ×2 IMPLANT
GOWN STRL REUS W/TWL LRG LVL3 (GOWN DISPOSABLE) ×4
HOSE CONNECTING 18IN BERKELEY (TUBING) ×2 IMPLANT
KIT BERKELEY 1ST TRI 3/8 NO TR (MISCELLANEOUS) ×2 IMPLANT
KIT BERKELEY 1ST TRIMESTER 3/8 (MISCELLANEOUS) ×2 IMPLANT
NS IRRIG 1000ML POUR BTL (IV SOLUTION) ×2 IMPLANT
PACK VAGINAL MINOR WOMEN LF (CUSTOM PROCEDURE TRAY) ×2 IMPLANT
PAD OB MATERNITY 4.3X12.25 (PERSONAL CARE ITEMS) ×2 IMPLANT
SET BERKELEY SUCTION TUBING (SUCTIONS) ×2 IMPLANT
TOWEL GREEN STERILE FF (TOWEL DISPOSABLE) ×3 IMPLANT
UNDERPAD 30X36 HEAVY ABSORB (UNDERPADS AND DIAPERS) ×2 IMPLANT
VACURETTE 10 RIGID CVD (CANNULA) IMPLANT
VACURETTE 7MM CVD STRL WRAP (CANNULA) IMPLANT
VACURETTE 8 RIGID CVD (CANNULA) ×1 IMPLANT
VACURETTE 9 RIGID CVD (CANNULA) IMPLANT

## 2021-05-03 NOTE — Brief Op Note (Signed)
05/03/2021  2:52 PM  PATIENT:  Amy Francis  35 y.o. female  PRE-OPERATIVE DIAGNOSIS:  missed ab  POST-OPERATIVE DIAGNOSIS:  missed ab  PROCEDURE:  Procedure(s): DILATATION AND EVACUATION (N/A)  SURGEON:  Surgeon(s) and Role:    * Bobbye Charleston, MD - Primary  ANESTHESIA:   MAC  EBL: <50cc  LOCAL MEDICATIONS USED:  NONE  SPECIMEN:  Source of Specimen:  uterine contents  DISPOSITION OF SPECIMEN:  PATHOLOGY  COUNTS:  YES  TOURNIQUET:  * No tourniquets in log *  DICTATION: .Note written in EPIC  PLAN OF CARE: Discharge to home after PACU  PATIENT DISPOSITION:  PACU - hemodynamically stable.   Delay start of Pharmacological VTE agent (>24hrs) due to surgical blood loss or risk of bleeding: not applicable

## 2021-05-03 NOTE — Op Note (Addendum)
05/03/2021  2:52 PM  PATIENT:  Amy Francis  35 y.o. female  PRE-OPERATIVE DIAGNOSIS:  missed ab  POST-OPERATIVE DIAGNOSIS:  missed ab  PROCEDURE:  Procedure(s): DILATATION AND EVACUATION (N/A)  SURGEON:  Surgeon(s) and Role:    * Bobbye Charleston, MD - Primary  ANESTHESIA:   MAC  EBL: <50cc  LOCAL MEDICATIONS USED:  NONE  SPECIMEN:  Source of Specimen:  uterine contents  DISPOSITION OF SPECIMEN:  PATHOLOGY  COUNTS:  YES  TOURNIQUET:  * No tourniquets in log *  DICTATION: .Note written in EPIC  PLAN OF CARE: Discharge to home after PACU  PATIENT DISPOSITION:  PACU - hemodynamically stable.   Delay start of Pharmacological VTE agent (>24hrs) due to surgical blood loss or risk of bleeding: not applicable Medications: Methergine  Complications: None  Findings:  7 week size uterus to 6 size post procedure.  Good crie was achieved.  After adequate anesthesia was achieved, the patient was prepped and draped in the usual sterile fashion.  The speculum was placed in the vagina and the cervix stabilized with a single-tooth tenaculum.  The cervix was dilated with Kennon Rounds dilators and the 8 mm curette was used to remove contents of the uterus.  Alternating sharp curettage with a curette and suction curettage was performed until all contents were removed and good crie was achieved.  All instruments were removed from the vagina.  The patient tolerated the procedure well.    Ninfa Giannelli A

## 2021-05-03 NOTE — Anesthesia Procedure Notes (Signed)
Procedure Name: MAC Date/Time: 05/03/2021 2:48 PM Performed by: Annamary Carolin, CRNA Pre-anesthesia Checklist: Patient identified, Emergency Drugs available, Suction available and Patient being monitored Patient Re-evaluated:Patient Re-evaluated prior to induction Preoxygenation: Pre-oxygenation with 100% oxygen Induction Type: IV induction Placement Confirmation: CO2 detector, breath sounds checked- equal and bilateral and positive ETCO2 Dental Injury: Teeth and Oropharynx as per pre-operative assessment

## 2021-05-03 NOTE — Transfer of Care (Signed)
Immediate Anesthesia Transfer of Care Note  Patient: Amy Francis  Procedure(s) Performed: DILATATION AND EVACUATION  Patient Location: PACU  Anesthesia Type:General  Level of Consciousness: awake, alert , oriented and patient cooperative  Airway & Oxygen Therapy: Patient Spontanous Breathing and Patient connected to face mask oxygen  Post-op Assessment: Report given to RN, Post -op Vital signs reviewed and stable and Patient moving all extremities X 4  Post vital signs: Reviewed and stable  Last Vitals:  Vitals Value Taken Time  BP 110/79 05/03/21 1504  Temp 36.8 C 05/03/21 1503  Pulse 94 05/03/21 1507  Resp 25 05/03/21 1507  SpO2 98 % 05/03/21 1507  Vitals shown include unvalidated device data.  Last Pain:  Vitals:   05/03/21 1238  TempSrc:   PainSc: 0-No pain      Patients Stated Pain Goal: 2 (05/03/21 1238)  Complications: No notable events documented.

## 2021-05-03 NOTE — Progress Notes (Signed)
Patient's BP 169/107 upon arrival to Short Stay. Dr. Donavan Foil with anesthesia made aware.

## 2021-05-03 NOTE — Anesthesia Postprocedure Evaluation (Signed)
Anesthesia Post Note  Patient: Amy Francis  Procedure(s) Performed: DILATATION AND EVACUATION     Patient location during evaluation: PACU Anesthesia Type: MAC Level of consciousness: awake and alert Pain management: pain level controlled Vital Signs Assessment: post-procedure vital signs reviewed and stable Respiratory status: spontaneous breathing and respiratory function stable Cardiovascular status: stable Postop Assessment: no apparent nausea or vomiting Anesthetic complications: no   No notable events documented.  Last Vitals:  Vitals:   05/03/21 1503 05/03/21 1518  BP: 110/79 121/84  Pulse: 88 90  Resp: 17 20  Temp: 36.8 C   SpO2: 100% 100%    Last Pain:  Vitals:   05/03/21 1518  TempSrc:   PainSc: 0-No pain                 [name]

## 2021-05-03 NOTE — H&P (Signed)
35 y.o. G1P1001 with miscarriage for D&E.  Pt is RHpos.   Past Medical History:  Diagnosis Date   Anxiety    Past Surgical History:  Procedure Laterality Date   APPENDECTOMY     CESAREAN SECTION  10/09/2009    Social History   Socioeconomic History   Marital status: Single    Spouse name: Not on file   Number of children: Not on file   Years of education: Not on file   Highest education level: Not on file  Occupational History   Not on file  Tobacco Use   Smoking status: Never   Smokeless tobacco: Never  Vaping Use   Vaping Use: Never used  Substance and Sexual Activity   Alcohol use: No   Drug use: No   Sexual activity: Yes    Birth control/protection: Condom  Other Topics Concern   Not on file  Social History Narrative   Parent educator- Programme researcher, 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   Current Outpatient Medications on File Prior to Encounter  Medication Sig Dispense Refill   amLODipine (NORVASC) 5 MG tablet Take 1 tablet (5 mg total) by mouth daily. Past due for assessment, please call our office asap for follow up in order to get additional refills or grater quantity. 90 tablet 0   diphenhydrAMINE (BENADRYL) 25 MG tablet Take 25 mg by mouth every 6 (six) hours as needed for allergies.     FLUoxetine (PROZAC) 10 MG capsule Take 1 capsule (10 mg total) by mouth daily. 30 capsule 0   Multiple Vitamins-Minerals (MULTIVITAMIN WITH MINERALS) tablet Take 1 tablet by mouth daily.      No Known Allergies  Vitals:   05/02/21 1321  Weight:  104.3 kg  Height: 5\' 5"  (1.651 m)    Lungs: clear to ascultation Cor:  RRR Abdomen:  soft, nontender, nondistended. Ex:  no cords, erythema Pelvic:  7 weeks size  A:  For D&E   P: P: All risks, benefits and alternatives d/w patient and she desires to proceed.  Doxycycline on call to or. 

## 2021-05-03 NOTE — Progress Notes (Signed)
There has been no change in the patients history, status or exam since the history and physical.  Vitals:   05/02/21 1321 05/03/21 1216 05/03/21 1302 05/03/21 1326  BP:  (!) 169/107 (!) 172/98 (!) 164/95  Pulse:  96    Resp:  18    Temp:  98.4 F (36.9 C)    TempSrc:  Oral    SpO2:  100%    Weight: 104.3 kg 122.5 kg    Height: 5\' 5"  (1.651 m) 5\' 5"  (1.651 m)      Results for orders placed or performed during the hospital encounter of 05/03/21 (from the past 72 hour(s))  Type and screen [facility]     Status: None (Preliminary result)   Collection Time: 05/03/21 12:30 PM  Result Value Ref Range   ABO/RH(D) PENDING    Antibody Screen PENDING    Sample Expiration      05/06/2021,2359 Performed at [facility], [address] [place], [address] [place]   ABO/Rh     Status: None   Collection Time: 05/03/21 12:35 PM  Result Value Ref Range   ABO/RH(D)      O NEG Performed at [facility], [address], [place], [address] [place]   CBC     Status: None   Collection Time: 05/03/21 12:55 PM  Result Value Ref Range   WBC 8.9 4.0 - 10.5 K/uL   RBC 4.63 3.87 - 5.11 MIL/uL   Hemoglobin 14.2 12.0 - 15.0 g/dL   HCT 82423 05/05/21 - 53.6 %   MCV 92.9 80.0 - 100.0 fL   MCH 30.7 26.0 - 34.0 pg   MCHC 33.0 30.0 - 36.0 g/dL   RDW 14.4 31.5 - 40.0 %   Platelets 336 150 - 400 K/uL   nRBC 0.0 0.0 - 0.2 %    Comment: Performed at [facility], [address] [place], [address] [place]  Basic metabolic panel     Status: None   Collection Time: 05/03/21  1:23 PM  Result Value Ref Range   Sodium 135 135 - 145 mmol/L   Potassium 3.9 3.5 - 5.1 mmol/L   Chloride 103 98 - 111 mmol/L   CO2 26 22 - 32 mmol/L   Glucose, Bld 98 70 - 99 mg/dL    Comment: Glucose reference range applies only to samples taken after fasting for at least 8 hours.   BUN 7 6 - 20 mg/dL   Creatinine, Ser 50932 0.44 - 1.00 mg/dL   Calcium 9.4 8.9 - 05/05/21 mg/dL   GFR, Estimated 6.71 24.5  mL/min    Comment: (NOTE) Calculated using the CKD-EPI Creatinine Equation (2021)    Anion gap 6 5 - 15    Comment: Performed at [facility], [address] [place], [address] [place]    [place]

## 2021-05-03 NOTE — Anesthesia Preprocedure Evaluation (Signed)
Anesthesia Evaluation  Patient identified by MRN, date of birth, ID band Patient awake    Reviewed: Allergy & Precautions, NPO status , Patient's Chart, lab work & pertinent test results  Airway Mallampati: I  TM Distance: >3 FB Neck ROM: Full    Dental no notable dental hx.    Pulmonary neg pulmonary ROS,    Pulmonary exam normal breath sounds clear to auscultation       Cardiovascular hypertension, Pt. on medications negative cardio ROS Normal cardiovascular exam Rhythm:Regular Rate:Normal     Neuro/Psych Anxiety negative neurological ROS  negative psych ROS   GI/Hepatic negative GI ROS, Neg liver ROS,   Endo/Other  negative endocrine ROS  Renal/GU negative Renal ROS  negative genitourinary   Musculoskeletal negative musculoskeletal ROS (+)   Abdominal (+) + obese,   Peds negative pediatric ROS (+)  Hematology negative hematology ROS (+)   Anesthesia Other Findings   Reproductive/Obstetrics negative OB ROS                             Anesthesia Physical Anesthesia Plan  ASA: 2  Anesthesia Plan: MAC   Post-op Pain Management:    Induction: Intravenous  PONV Risk Score and Plan: 2 and Propofol infusion, TIVA, Treatment may vary due to age or medical condition and Ondansetron  Airway Management Planned: Natural Airway and Simple Face Mask  Additional Equipment: None  Intra-op Plan:   Post-operative Plan:   Informed Consent: I have reviewed the patients History and Physical, chart, labs and discussed the procedure including the risks, benefits and alternatives for the proposed anesthesia with the patient or authorized representative who has indicated his/her understanding and acceptance.     Dental advisory given  Plan Discussed with: CRNA, Anesthesiologist and Surgeon  Anesthesia Plan Comments: (MAC. GA/LMA as backup plan. Norton Blizzard, MD  )        Anesthesia  Quick Evaluation

## 2021-05-04 ENCOUNTER — Encounter (HOSPITAL_COMMUNITY): Payer: Self-pay | Admitting: Obstetrics and Gynecology

## 2021-05-04 LAB — SURGICAL PATHOLOGY

## 2021-05-13 ENCOUNTER — Encounter: Payer: BC Managed Care – PPO | Admitting: Family

## 2021-05-13 NOTE — Progress Notes (Incomplete)
Subjective:   By signing my name below, I, [name], attest that this documentation has been prepared under the direction and in the presence of Sandford Craze NP. 05/13/2021    Patient ID: Amy Francis, female    DOB: [birth year], 35 y.o.   MRN: [id number]  No chief complaint on file.   HPI Patient is in today for a comprehensive physical exam.   She denies having any unexpected weight change, hearing loss and rhinorrhea, visual disturbance, cough, chest pain and leg swelling, nausea, vomiting, diarrhea and blood in stool, or dysuria and frequency, for myalgias and arthralgias, rash, headaches, adenopathy, depression or anxiety at this time.   Immunizations: Diet: Exercise: Pap Smear: Last copmleted Dental: Vision:   Health Maintenance Due  Topic Date Due   COVID-19 Vaccine (1) Never done   HIV Screening  Never done   Hepatitis C Screening  Never done   PAP SMEAR-Modifier  01/23/2021   INFLUENZA VACCINE  05/09/2021    Past Medical History:  Diagnosis Date   Anxiety    Hypertension     Past Surgical History:  Procedure Laterality Date   APPENDECTOMY     CESAREAN SECTION  10/09/2009   DILATION AND EVACUATION N/A 05/03/2021   Procedure: DILATATION AND EVACUATION;  Surgeon: Carrington Clamp, MD;  Location: [facility] OR;  Service: Gynecology;  Laterality: N/A;    Family History  Problem Relation Age of Onset   Diabetes Mother        deceased from DM complication   Hyperlipidemia Father    Cancer Other     Social History   Socioeconomic History   Marital status: Single    Spouse name: Not on file   Number of children: Not on file   Years of education: Not on file   Highest education level: Not on file  Occupational History   Not on file  Tobacco Use   Smoking status: Never   Smokeless tobacco: Never  Vaping Use   Vaping Use: Never used  Substance and Sexual Activity   Alcohol use: No   Drug use: No   Sexual activity: Yes    Birth  control/protection: Condom  Other Topics Concern   Not on file  Social History Narrative   Parent educator- Programme researcher, 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 Past due for assessment, please call our office asap for follow up in order to get additional refills or grater quantity. 90 tablet 0   diphenhydrAMINE (BENADRYL) 25 MG tablet Take 25 mg by mouth every 6 (six) hours as needed for allergies.     FLUoxetine (PROZAC) 10 MG capsule Take 1 capsule (10 mg total) by mouth daily. 30 capsule 0   Multiple Vitamins-Minerals (MULTIVITAMIN WITH MINERALS) tablet Take 1 tablet by mouth daily.     No facility-administered medications prior to visit.    No Known Allergies  Review of Systems  Constitutional:        (-)  unexpected weight change (-)Adenopathy  HENT:  Negative for hearing loss.        (-)Rhinorrhea   Eyes:        (-)Visual disturbance  Respiratory:  Negative for cough.   Cardiovascular:  Negative for chest pain and leg swelling.  Gastrointestinal:  Negative for blood in stool, constipation, diarrhea, nausea and vomiting.  Genitourinary:  Negative for dysuria and frequency.  Musculoskeletal:  Negative for joint pain and myalgias.  Skin:  Negative for rash.  Neurological:  Negative for headaches.  Psychiatric/Behavioral:  Negative for depression. The patient is not nervous/anxious.       Objective:    Physical Exam Constitutional:      General: She is not in acute  distress.    Appearance: Normal appearance. She is not ill-appearing.  HENT:     Head: Normocephalic and atraumatic.     Right Ear: Tympanic membrane, ear canal and external ear normal.     Left Ear: Tympanic membrane, ear canal and external ear normal.  Eyes:     Extraocular Movements: Extraocular movements intact.     Pupils: Pupils are equal, round, and reactive to light.     Comments: No nystagmus  Cardiovascular:     Rate and Rhythm: Regular rhythm.     Pulses: Normal pulses.     Heart sounds: Normal heart sounds. No murmur heard.   No gallop.  Pulmonary:     Effort: Pulmonary effort is normal. No respiratory distress.     Breath sounds: Normal breath sounds. No wheezing or rales.  Abdominal:     General: Bowel sounds are normal. There is no distension.     Palpations: Abdomen is soft. There is no mass.     Tenderness: There is no abdominal tenderness. There is no guarding or rebound.  Musculoskeletal:     Comments: 5/5 strength in both upper and lower extremities  Skin:    General: Skin is warm and dry.  Neurological:     Mental Status: She is alert and oriented to person, place, and time.     Deep Tendon Reflexes:     Reflex Scores:      Patellar reflexes are 2+ on the right side and 2+ on the left side. Psychiatric:        Behavior: Behavior normal.    There were no vitals taken for this visit. Wt Readings from Last 3 Encounters:  05/03/21 270 lb (122.5 kg)  04/30/20 (!) 228 lb (103.4 kg)  08/29/19 226 lb (102.5 kg)       Assessment & Plan:   Problem List Items Addressed This Visit   None    No orders of the defined types were placed in this encounter.   I, Sandford Craze NP, personally preformed the services described in this documentation.  All medical record entries made by the scribe were at my direction and in my presence.  I have reviewed the chart and discharge instructions (if applicable) and agree that the record reflects my personal  performance and is accurate and complete. 05/13/2021   I,[name],acting as a Neurosurgeon for Lemont Fillers, NP.,have documented all relevant documentation on the behalf of Lemont Fillers, NP,as directed by  Lemont Fillers, NP while in the presence of Lemont Fillers, NP.   Shehryar [employer]

## 2021-05-18 ENCOUNTER — Encounter: Payer: BC Managed Care – PPO | Admitting: Family

## 2021-05-24 ENCOUNTER — Encounter: Payer: BC Managed Care – PPO | Admitting: Family

## 2021-05-30 ENCOUNTER — Encounter: Payer: Self-pay | Admitting: Family

## 2021-05-30 MED ORDER — FLUOXETINE HCL 10 MG PO CAPS: 10.0000 mg | ORAL_CAPSULE | Freq: Every day | ORAL | 0 refills | Status: DC

## 2021-05-30 MED ORDER — AMLODIPINE BESYLATE 5 MG PO TABS: 5.0000 mg | ORAL_TABLET | Freq: Every day | ORAL | 0 refills | Status: DC

## 2021-07-11 ENCOUNTER — Other Ambulatory Visit: Payer: Self-pay | Admitting: Family

## 2021-07-12 ENCOUNTER — Telehealth: Payer: Self-pay | Admitting: Family

## 2021-07-12 MED ORDER — AMLODIPINE BESYLATE 5 MG PO TABS: 5.0000 mg | ORAL_TABLET | Freq: Every day | ORAL | 0 refills | Status: DC

## 2021-07-12 MED ORDER — FLUOXETINE HCL 10 MG PO CAPS: 10.0000 mg | ORAL_CAPSULE | Freq: Every day | ORAL | 0 refills | Status: DC

## 2021-07-12 NOTE — Telephone Encounter (Signed)
Spoke with patient and advised that appt is needed for any further refills and she must keep appointment.  Appt made and rxs sent in.

## 2021-07-12 NOTE — Telephone Encounter (Signed)
Medication:  FLUoxetine (PROZAC) 10 MG capsule  amLODipine (NORVASC) 5 MG tablet   Has the patient contacted their pharmacy? No. (If no, request that the patient contact the pharmacy for the refill.) (If yes, when and what did the pharmacy advise?)  Preferred Pharmacy (with phone number or street name):  Pharmacy  [id number] - [place], [place] - [address] AT [facility] [address] [place] [address]  Phone:  [phone]  Fax:  [phone]    Agent: Please be advised that RX refills may take up to 3 business days. We ask that you follow-up with your pharmacy.

## 2021-07-29 ENCOUNTER — Ambulatory Visit: Payer: BC Managed Care – PPO | Admitting: Family

## 2021-08-08 ENCOUNTER — Ambulatory Visit: Payer: BC Managed Care – PPO | Admitting: Family

## 2021-08-19 ENCOUNTER — Ambulatory Visit: Payer: BC Managed Care – PPO | Admitting: Family

## 2021-08-30 ENCOUNTER — Telehealth: Payer: BC Managed Care – PPO | Admitting: Family

## 2021-08-30 ENCOUNTER — Other Ambulatory Visit: Payer: Self-pay

## 2021-08-30 MED ORDER — AMLODIPINE BESYLATE 5 MG PO TABS: 5.0000 mg | ORAL_TABLET | Freq: Every day | ORAL | 0 refills | Status: DC

## 2021-09-02 NOTE — Progress Notes (Signed)
Pt rescheduled appointment.

## 2021-09-06 ENCOUNTER — Telehealth: Payer: Self-pay | Admitting: Family

## 2021-09-06 ENCOUNTER — Other Ambulatory Visit: Payer: Self-pay

## 2021-09-06 MED ORDER — AMLODIPINE BESYLATE 5 MG PO TABS: 5.0000 mg | ORAL_TABLET | Freq: Every day | ORAL | 0 refills | Status: DC
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
## 2021-09-07 ENCOUNTER — Ambulatory Visit (HOSPITAL_COMMUNITY)
Admission: EM | Admit: 2021-09-07 | Discharge: 2021-09-07 | Disposition: A | Discharge status: Home / Self Care | Payer: BC Managed Care – PPO | Attending: Emergency Medicine | Admitting: Emergency Medicine

## 2021-09-07 ENCOUNTER — Other Ambulatory Visit: Payer: Self-pay

## 2021-09-07 ENCOUNTER — Ambulatory Visit (INDEPENDENT_AMBULATORY_CARE_PROVIDER_SITE_OTHER): Payer: BC Managed Care – PPO

## 2021-09-07 ENCOUNTER — Encounter (HOSPITAL_COMMUNITY): Payer: Self-pay | Admitting: Emergency Medicine

## 2021-09-07 DIAGNOSIS — Z20822 Contact with and (suspected) exposure to covid-19: Secondary | ICD-10-CM | POA: Insufficient documentation

## 2021-09-07 DIAGNOSIS — S62524A Nondisplaced fracture of distal phalanx of right thumb, initial encounter for closed fracture: Secondary | ICD-10-CM | POA: Diagnosis not present

## 2021-09-07 DIAGNOSIS — R509 Fever, unspecified: Secondary | ICD-10-CM

## 2021-09-07 DIAGNOSIS — M79644 Pain in right finger(s): Secondary | ICD-10-CM | POA: Diagnosis not present

## 2021-09-07 DIAGNOSIS — Y92410 Unspecified street and highway as the place of occurrence of the external cause: Secondary | ICD-10-CM | POA: Insufficient documentation

## 2021-09-07 DIAGNOSIS — S62521A Displaced fracture of distal phalanx of right thumb, initial encounter for closed fracture: Secondary | ICD-10-CM | POA: Diagnosis not present

## 2021-09-07 DIAGNOSIS — S161XXA Strain of muscle, fascia and tendon at neck level, initial encounter: Secondary | ICD-10-CM | POA: Diagnosis not present

## 2021-09-07 DIAGNOSIS — J029 Acute pharyngitis, unspecified: Secondary | ICD-10-CM | POA: Insufficient documentation

## 2021-09-07 DIAGNOSIS — M436 Torticollis: Secondary | ICD-10-CM | POA: Diagnosis not present

## 2021-09-07 DIAGNOSIS — M7989 Other specified soft tissue disorders: Secondary | ICD-10-CM | POA: Diagnosis not present

## 2021-09-07 MED ORDER — CYCLOBENZAPRINE HCL 5 MG PO TABS: 5.0000 mg | ORAL_TABLET | Freq: Three times a day (TID) | ORAL | 0 refills | Status: DC | PRN
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
## 2021-09-07 NOTE — Discharge Instructions (Addendum)
Use ibuprofen for pain.  You can use the muscle relaxer cyclobenzaprine up to 3 times a day for muscle spasm and pain but it does make you sleepy, so some people take it only at night.  It is okay to gently stretch your neck and shoulder.  You might consider also icing your thumb to reduce swelling.  Follow-up with Dr. Merry Proud about your thumb fracture.  You will get a call if your COVID test is positive.  Otherwise you will not get a phone call.  If you have a MyChart account you can check results in MyChart.

## 2021-09-08 LAB — SARS CORONAVIRUS 2 (TAT 6-24 HRS): SARS Coronavirus 2: NEGATIVE

## 2021-09-11 ENCOUNTER — Telehealth (HOSPITAL_COMMUNITY): Payer: Self-pay | Admitting: Emergency Medicine

## 2021-09-11 NOTE — Telephone Encounter (Signed)
Patient requested a splint for thumb, stating she had lost the one provided at visit.  Spoke to patient in lobby.  Patient showed a picture of what appeared to be a pronged finger splint.  Unable to locate this splint in department.  Asked several staff about this in our department.  Clinical staff reported they as well not familiar with this type of splint being available in this department.  Conveyed this to patient.  Patient not pleased.  Asked for coban.  Did give a roll of coban to patient.  Patient's right thumb nail was shorter than remaining nails as discussed in provider note.  Patient reports she lost other splint

## 2021-09-15 ENCOUNTER — Ambulatory Visit (INDEPENDENT_AMBULATORY_CARE_PROVIDER_SITE_OTHER): Payer: BC Managed Care – PPO | Admitting: Plastic Surgery

## 2021-09-15 ENCOUNTER — Other Ambulatory Visit: Payer: Self-pay

## 2021-09-15 ENCOUNTER — Telehealth: Payer: Self-pay

## 2021-09-15 ENCOUNTER — Encounter: Payer: Self-pay | Admitting: Plastic Surgery

## 2021-09-15 VITALS — BP 153/93 | HR 115 | Ht 65.0 in | Wt 236.0 lb

## 2021-09-15 DIAGNOSIS — S6991XA Unspecified injury of right wrist, hand and finger(s), initial encounter: Secondary | ICD-10-CM
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
## 2021-09-15 NOTE — Telephone Encounter (Signed)
Patient said she had an appointment with Dr. Arita Miss today and he said she had a small fracture to her thumb.  She said he said it will subside.  Patient said she is a little confused and would like to have clarification on her care instructions.  Please call.

## 2021-09-22 NOTE — Telephone Encounter (Signed)
Called on (09/15/21) and spoke with the patient regarding her message below.  Patient stated that Dr. Arita Miss is saying she don't have a fracture,so what am I to do.  She said I have this pain in my thumb and he's not telling me what to do.    She stated that she was not happy, and she only came because the ER put it on her after visit information to come see him.  She said otherwise she would not have come.    She asked again what is she to do about her thumb.  Informed the patient of the information from the office visit note that she can take the Tylenol and Ibuprofen for the pain, and use the splint for immobilization for comfort.    Patient stated that Dr. Arita Miss said she can come back for a follow-up visit, but she stated that she will not be coming back.    I apologized to the patient for the way she felt her visit went.//AB/CMA

## 2021-09-30 ENCOUNTER — Ambulatory Visit (INDEPENDENT_AMBULATORY_CARE_PROVIDER_SITE_OTHER): Payer: BC Managed Care – PPO | Admitting: Family

## 2021-09-30 VITALS — BP 149/92 | HR 102 | Temp 98.4°F | Resp 16 | Wt 238.0 lb

## 2021-09-30 DIAGNOSIS — S62524A Nondisplaced fracture of distal phalanx of right thumb, initial encounter for closed fracture: Secondary | ICD-10-CM | POA: Diagnosis not present

## 2021-09-30 DIAGNOSIS — Z23 Encounter for immunization: Secondary | ICD-10-CM

## 2021-09-30 DIAGNOSIS — I10 Essential (primary) hypertension: Secondary | ICD-10-CM

## 2021-09-30 DIAGNOSIS — F411 Generalized anxiety disorder: Secondary | ICD-10-CM | POA: Diagnosis not present

## 2021-09-30 MED ORDER — FLUOXETINE HCL 10 MG PO CAPS: 10.0000 mg | ORAL_CAPSULE | Freq: Every day | ORAL | 1 refills | Status: DC

## 2021-09-30 MED ORDER — AMLODIPINE BESYLATE 10 MG PO TABS: 10.0000 mg | ORAL_TABLET | Freq: Every day | ORAL | 1 refills | Status: DC

## 2021-09-30 NOTE — Patient Instructions (Addendum)
Please increase amlodipine to 10mg  once daily.  Follow up in one month for office visit.  Take fluoxetine daily.

## 2021-09-30 NOTE — Assessment & Plan Note (Signed)
Uncontrolled. Will increase amlodipine from 5mg  to 10mg  once daily.

## 2021-09-30 NOTE — Assessment & Plan Note (Signed)
Pt advised to follow up with orthopedics.  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

## 2021-09-30 NOTE — Assessment & Plan Note (Signed)
Uncontrolled.  Recommended that she begin taking fluoxetine daily.
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
## 2021-10-30 DIAGNOSIS — J111 Influenza due to unidentified influenza virus with other respiratory manifestations: Secondary | ICD-10-CM | POA: Diagnosis not present

## 2021-11-07 ENCOUNTER — Telehealth: Payer: Self-pay

## 2021-11-07 NOTE — Telephone Encounter (Signed)
Spoke with patient regarding symptoms.  Patient reports small amount of congestion as only current symptom.  Loss of taste started on 10/29/21.  Patient offered appointment, but declined. She states she's "feeling okay".  Advised of mask and quarantine recommendations as per CDC guidelines.

## 2021-11-07 NOTE — Telephone Encounter (Signed)
Nurse Assessment Nurse: Susy Manor, RN, Megan Date/Time (Eastern Time): 11/07/2021 12:43:45 PM Confirm and document reason for call. If symptomatic, describe symptoms. ---Caller states she has been congested since last week and has tested positive for covid. Caller states last Tuesday she lost her sense taste. Her symptoms have improved. Does the patient have any new or worsening symptoms? ---Yes Will a triage be completed? ---Yes Related visit to physician within the last 2 weeks? ---Yes Does the PT have any chronic conditions? (i.e. diabetes, asthma, this includes High risk factors for pregnancy, etc.) ---No Is the patient pregnant or possibly pregnant? (Ask all females between the ages of 47-55) ---No Is this a behavioral health or substance abuse call? ---No Guidelines Guideline Title Affirmed Question Affirmed Notes Nurse Date/Time (Eastern Time) COVID-19 - Diagnosed or Suspected [1] COVID-19 diagnosed by positive lab test (e.g., PCR, rapid self-test kit) AND [2] mild symptoms (e.g., cough, fever, others) AND [3] no Susy Manor, RN, Megan 11/07/2021 12:46:16 PM PLEASE NOTE: All timestamps contained within this report are represented as [place] Standard Time. CONFIDENTIALTY NOTICE: This fax transmission is intended only for the addressee. It contains information that is legally privileged, confidential or otherwise protected from use or disclosure. If you are not the intended recipient, you are strictly prohibited from reviewing, disclosing, copying using or disseminating any of this information or taking any action in reliance on or regarding this information. If you have received this fax in error, please notify us immediately by telephone so that we can arrange for its return to [place]. Phone: [phone], Toll-Free: [phone], Fax: [phone] Page: 2 of 2 Call Id: [id number] Guidelines Guideline Title Affirmed Question Affirmed Notes Nurse Date/Time  Eilene Ghazi Time) complications or SOB Disp. Time Eilene Ghazi Time) Disposition Final User 11/07/2021 12:51:43 PM Home Care Yes Susy Manor, RN, Megan Caller Disagree/Comply Comply Caller Understands Yes PreDisposition Did not know what to do Care Advice Given Per Guideline HOME CARE: * You should be able to treat this at home. REASSURANCE AND EDUCATION - POSITIVE COVID-19 LAB TEST AND MILD SYMPTOMS: * You had a recent lab test for COVID-19 and it came back positive. COVID-19 - HOW TO PROTECT OTHERS - WHEN YOU ARE SICK WITH COVID-19: * STAY HOME A MINIMUM OF 5 DAYS: People with MILD COVID-19 can STOP HOME ISOLATION AFTER 5 DAYS if (1) fever has been gone for 24 hours (without using fever medicine) AND (2) symptoms are better. Continue to wear a well-fitted mask for a full 10 days when around others. * WEAR A MASK FOR 10 DAYS: Wear a well-fitted mask for 10 full days any time you are around others inside your home or in public. Do not go to places where you are unable to wear a mask. * AVOID TRAVEL: Avoid travel for 10 days after you tested positive for COVID-19. HUMIDIFIER: * If the air is dry, use a humidifier in the bedroom. COUGHING SPELLS: * Drink warm fluids. Inhale warm mist. This can help relax the airway and also loosen up phlegm. * HOME REMEDY - HONEY: This old home remedy has been shown to help decrease coughing at night. The adult dosage is 2 teaspoons (10 ml) at bedtime. GENERAL CARE ADVICE FOR COVID-19 SYMPTOMS: CARE ADVICE given per COVID-19 - DIAGNOSED OR SUSPECTED (Adult) guideline. CALL BACK IF: * You become worse Comments User: Sula Soda, RN Date/Time Eilene Ghazi Time): 11/07/2021 12:47:03 PM Caller states she take amlodipine for HTN

## 2021-11-08 NOTE — Telephone Encounter (Signed)
Noted  

## 2021-11-28 ENCOUNTER — Telehealth: Payer: Self-pay | Admitting: Family

## 2021-11-28 NOTE — Telephone Encounter (Signed)
This has been sent to the pt per her request. She is aware.

## 2021-11-28 NOTE — Telephone Encounter (Signed)
Pt stated that she is needing visit information//medical records from [facility] 09/30/21 requested by her lawyer.   Please advise.

## 2021-12-16 ENCOUNTER — Encounter (HOSPITAL_COMMUNITY): Payer: Self-pay | Admitting: Emergency Medicine

## 2021-12-16 ENCOUNTER — Telehealth: Payer: Self-pay | Admitting: Family

## 2021-12-16 ENCOUNTER — Ambulatory Visit (HOSPITAL_COMMUNITY)
Admission: EM | Admit: 2021-12-16 | Discharge: 2021-12-16 | Disposition: A | Discharge status: Home / Self Care | Payer: BC Managed Care – PPO | Attending: Student | Admitting: Student

## 2021-12-16 ENCOUNTER — Other Ambulatory Visit: Payer: Self-pay

## 2021-12-16 DIAGNOSIS — H18891 Other specified disorders of cornea, right eye: Secondary | ICD-10-CM | POA: Diagnosis not present

## 2021-12-16 MED ORDER — OLOPATADINE HCL 0.1 % OP SOLN: 1.0000 [drp] | Freq: Two times a day (BID) | OPHTHALMIC | 12 refills | Status: DC

## 2021-12-16 NOTE — Discharge Instructions (Addendum)
-  Pataday drops 2x daily as needed ?-Follow-up with your eye doctor as scheduled on 3/15 ?-If your symptoms get worse, like vision changes, vision loss, flashes of light or floaters in a field of vision-head to the emergency department or call your eye doctor immediately. ?

## 2021-12-16 NOTE — ED Provider Notes (Addendum)
?[facility] ? ? ? ?CSN: [id number] ?Arrival date & time: 12/16/21  1702 ? ? ?  ? ?History   ?Chief Complaint ?Chief Complaint  ?Patient presents with  ? Eye Problem  ? ? ?HPI ?Amy Francis is a 36 y.o. female describes right eye irritation for 2 days.  History allergic rhinitis.  Describes right eye irritation, feeling dry.  Some irritation with movement.  States it seems little bit blurred, and blinking does not clear that up.  Denies trauma to the eye, denies foreign body sensation, denies photophobia.  Does not wear contacts or glasses. Has eyelash extensions, these have been in place for >3 weeks.  Denies photophobia, foreign body sensation, eye redness, eye crusting in the morning, eye pain, eye pain with movement, injury to eye, double vision, excessive tearing, burning eyes ? ? ?HPI ? ?Past Medical History:  ?Diagnosis Date  ? Anxiety   ? Hypertension   ? ? ?Patient Active Problem List  ? Diagnosis Date Noted  ? Closed nondisplaced fracture of distal phalanx of right thumb 09/30/2021  ? Attention deficit 02/15/2021  ? Hypertension 02/15/2021  ? Carpal tunnel syndrome 10/14/2013  ? Unspecified vitamin D deficiency 07/21/2013  ? Anxiety state 07/14/2013  ? Paresthesia 07/14/2013  ? Atypical chest pain 07/14/2013  ? Obesity (BMI 30-39.9) 07/14/2013  ? ? ?Past Surgical History:  ?Procedure Laterality Date  ? APPENDECTOMY    ? CESAREAN SECTION  10/09/2009  ? DILATION AND EVACUATION N/A 05/03/2021  ? Procedure: DILATATION AND EVACUATION;  Surgeon: Bobbye Charleston, MD;  Location: [facility];  Service: Gynecology;  Laterality: N/A;  ? ? ?OB History   ? ? Gravida  ?1  ? Para  ?1  ? Term  ?1  ? Preterm  ?0  ? AB  ?0  ? Living  ?1  ?  ? ? SAB  ?0  ? IAB  ?0  ? Ectopic  ?0  ? Multiple  ?0  ? Live Births  ?   ?   ?  ?  ? ? ? ?Home Medications   ? ?Prior to Admission medications   ?Medication Sig Start Date End Date Taking? Authorizing Provider  ?amLODipine (NORVASC) 10 MG tablet Take 1 tablet (10 mg total) by  mouth daily. 09/30/21  Yes Debbrah Alar, NP  ?diphenhydrAMINE (BENADRYL) 25 MG tablet Take 25 mg by mouth every 6 (six) hours as needed for allergies.   Yes [provider]  ?FLUoxetine (PROZAC) 10 MG capsule Take 1 capsule (10 mg total) by mouth daily. 09/30/21  Yes Debbrah Alar, NP  ?olopatadine (PATADAY) 0.1 % ophthalmic solution Place 1 drop into the right eye 2 (two) times daily. 12/16/21  Yes Hazel Sams, PA-C  ?cyclobenzaprine (FLEXERIL) 5 MG tablet Take 1 tablet (5 mg total) by mouth 3 (three) times daily as needed for muscle spasms. 09/07/21   Carvel Getting, NP  ?Multiple Vitamins-Minerals (MULTIVITAMIN WITH MINERALS) tablet Take 1 tablet by mouth daily.    [provider]  ? ? ?Family History ?Family History  ?Problem Relation Age of Onset  ? Diabetes Mother   ?     deceased from DM complication  ? Hyperlipidemia Father   ? Cancer Other   ? ? ?Social History ?Social History  ? ?Tobacco Use  ? Smoking status: Never  ? Smokeless tobacco: Never  ?Vaping Use  ? Vaping Use: Never used  ?Substance Use Topics  ? Alcohol use: No  ? Drug use: No  ? ? ? ?  Allergies   ?Patient has no known allergies. ? ? ?Review of Systems ?Review of Systems  ?Eyes:  Positive for itching.  ?All other systems reviewed and are negative. ? ? ?Physical Exam ?Triage Vital Signs ?ED Triage Vitals  ?Enc Vitals Group  ?   BP 12/16/21 1801 (!) 154/92  ?   Pulse Rate 12/16/21 1804 100  ?   Resp 12/16/21 1801 16  ?   Temp 12/16/21 1801 98.8 ?F (37.1 ?C)  ?   Temp Source 12/16/21 1801 Oral  ?   SpO2 12/16/21 1801 100 %  ?   Weight --   ?   Height --   ?   Head Circumference --   ?   Peak Flow --   ?   Pain Score 12/16/21 1800 2  ?   Pain Loc --   ?   Pain Edu? --   ?   Excl. in [place]? --   ? ?No data found. ? ?Updated Vital Signs ?BP (!) 154/92   Pulse 100   Temp 98.8 ?F (37.1 ?C) (Oral)   Resp 16   LMP 12/01/2021   SpO2 100%  ? ?Visual Acuity ?Right Eye Distance: 20/30 ?Left Eye Distance: 20/15 ?Bilateral  Distance: 20/15 ? ?Right Eye Near:   ?Left Eye Near:    ?Bilateral Near:    ? ?Physical Exam ?Vitals reviewed.  ?Constitutional:   ?   Appearance: Normal appearance.  ?HENT:  ?   Head: Normocephalic and atraumatic.  ?   Right Ear: Tympanic membrane, ear canal and external ear normal. There is no impacted cerumen.  ?   Left Ear: Tympanic membrane, ear canal and external ear normal. There is no impacted cerumen.  ?   Nose: Nose normal. No congestion.  ?   Mouth/Throat:  ?   Pharynx: Oropharynx is clear. No posterior oropharyngeal erythema.  ?Eyes:  ?   General: Lids are normal. Lids are everted, no foreign bodies appreciated. Vision grossly intact. Gaze aligned appropriately. No visual field deficit.    ?   Right eye: No foreign body, discharge or hordeolum.     ?   Left eye: No foreign body, discharge or hordeolum.  ?   Extraocular Movements: Extraocular movements intact.  ?   Right eye: Normal extraocular motion and no nystagmus.  ?   Left eye: Normal extraocular motion and no nystagmus.  ?   Conjunctiva/sclera: Conjunctivae normal.  ?   Right eye: Right conjunctiva is not injected. No chemosis, exudate or hemorrhage. ?   Left eye: Left conjunctiva is not injected. No chemosis, exudate or hemorrhage. ?   Pupils: Pupils are equal, round, and reactive to light.  ?   Visual Fields: Right eye visual fields normal and left eye visual fields normal.  ?   Comments: No conjunctival injection, exudate, hordeolum. PERRLA, EOMI without pain. No orbital tenderness. Visual acuity grossly intact. Eyelash extensions in place.  ?Cardiovascular:  ?   Rate and Rhythm: Normal rate and regular rhythm.  ?   Heart sounds: Normal heart sounds.  ?Pulmonary:  ?   Effort: Pulmonary effort is normal.  ?   Breath sounds: Normal breath sounds.  ?Neurological:  ?   General: No focal deficit present.  ?   Mental Status: She is alert.  ?Psychiatric:     ?   Mood and Affect: Mood normal.     ?   Behavior: Behavior normal.     ?   Thought Content:  Thought  content normal.     ?   Judgment: Judgment normal.  ? ? ? ?UC Treatments / Results  ?Labs ?(all labs ordered are listed, but only abnormal results are displayed) ?Labs Reviewed - No data to display ? ?EKG ? ? ?Radiology ?No results found. ? ?Procedures ?Procedures (including critical care time) ? ?Medications Ordered in UC ?Medications - No data to display ? ?Initial Impression / Assessment and Plan / UC Course  ?I have reviewed the triage vital signs and the nursing notes. ? ?Pertinent labs & imaging results that were available during my care of the patient were reviewed by me and considered in my medical decision making (see chart for details). ? ?  ? ?This patient is a very pleasant 36 y.o. year old female presenting with R eye irritation. Visual acuity intact; does not wear contacts or glasses. No trauma to the eye; no foreign body sensation, photophobia, etc. Suspect dry eye/ allergic rhinitis. Trial of pataday. Stop washing eye out with water throughout the day. F/u with optho as scheduled 3/16. ED return precautions discussed. Patient verbalizes understanding and agreement.  ? ? ?Final Clinical Impressions(s) / UC Diagnoses  ? ?Final diagnoses:  ?Corneal irritation of right eye  ? ? ? ?Discharge Instructions   ? ?  ?-Pataday drops 2x daily as needed ?-Follow-up with your eye doctor as scheduled on 3/15 ?-If your symptoms get worse, like vision changes, vision loss, flashes of light or floaters in a field of vision-head to the emergency department or call your eye doctor immediately. ? ? ? ?ED Prescriptions   ? ? Medication Sig Dispense Auth. Provider  ? olopatadine (PATADAY) 0.1 % ophthalmic solution Place 1 drop into the right eye 2 (two) times daily. 5 mL Hazel Sams, PA-C  ? ?  ? ?PDMP not reviewed this encounter. ?  ?Hazel Sams, PA-C ?12/16/21 1830 ? ?  ?Hazel Sams, PA-C ?12/16/21 1830 ? ?

## 2021-12-16 NOTE — ED Triage Notes (Signed)
Right eye itching started 2 days ago. Reports irritation when she moves eye side to side as well.  ?

## 2021-12-16 NOTE — Telephone Encounter (Signed)
Pt stated her right eye has been blurry for the last two days is unsure if she needs to be seen or not. She stated there were no other sxs like headache or dizziness. Transferred to triage to speak with a nurse.  ?

## 2021-12-19 NOTE — Telephone Encounter (Signed)
Patient evaluated/treated at [facility]. ? ? ?[facility] - Client TELEPHONE ADVICE RECORD AccessNurse? ?Patient Name:Ambur Matos ?Initial Comment Caller states she is calling to make an appointment. ?She is having blurred vision with her right eye. It ?gets a little painful. ?Translation No ?Nurse Assessment ?Nurse: Lexine Baton RN, Belenda Cruise Date/Time (Eastern Time): 12/16/2021 4:44:10 PM ?Confirm and document reason for call. If ?symptomatic, describe symptoms. ?---Caller states that she having some blurred vision on ?one eye that started two days ago, right eye is blurry ?and there is a little bit of pain. ?Does the patient have any new or worsening ?symptoms? ---Yes ?Will a triage be completed? ---Yes ?Related visit to physician within the last 2 weeks? ---No ?Does the PT have any chronic conditions? (i.e. ?diabetes, asthma, this includes High risk factors for ?pregnancy, etc.) ?---No ?Is the patient pregnant or possibly pregnant? (Ask ?all females between the ages of 53-55) ---No ?Is this a behavioral health or substance abuse call? ---No ?Guidelines ?Guideline Title Affirmed Question Affirmed Notes Nurse Date/Time (Eastern ?Time) ?Vision Loss or ?Change ?[1] Eye pain AND ?[2] brief (now gone) ?blurred vision or ?visual changes ?Donadeo, RN, ?Belenda Cruise ?12/16/2021 4:45:46 ?PM ?Disp. Time (Eastern ?Time) Disposition Final User ?12/16/2021 4:51:39 PM See HCP within 4 Hours (or ?PCP triage) ?Yes Donadeo, RN, Belenda Cruise ?PLEASE NOTE: All timestamps contained within this report are represented as [place] Standard Time. ?CONFIDENTIALTY NOTICE: This fax transmission is intended only for the addressee. It contains information that is legally privileged, confidential or ?otherwise protected from use or disclosure. If you are not the intended recipient, you are strictly prohibited from reviewing, disclosing, copying using ?or disseminating any of this information or taking any action in reliance on or regarding this  information. If you have received this fax in error, please ?notify us immediately by telephone so that we can arrange for its return to [place]. Phone: [phone], Toll-Free: [phone], Fax: [phone] ?Page: 2 of 2 ?Call Id: [id number] ?Caller Disagree/Comply Comply ?Caller Understands Yes ?PreDisposition Call Doctor ?Care Advice Given Per Guideline ?SEE HCP (OR PCP TRIAGE) WITHIN 4 HOURS: * IF OFFICE WILL BE CLOSED AND NO PCP (PRIMARY CARE ?PROVIDER) SECOND-LEVEL TRIAGE: You need to be seen within the next 3 or 4 hours. A nearby Urgent Care Center [facility]) ?is often a good source of care. Another choice is to go to the ED. Go sooner if you become worse. ANOTHER ADULT SHOULD ?DRIVE: * It is better and safer if another adult drives instead of you. CALL BACK IF: * You become worse CARE ADVICE given ?per Vision Loss or Change (Adult) guideline. ?Referrals ?GO TO FACILITY UNDECIDED ?

## 2021-12-19 NOTE — Telephone Encounter (Signed)
This message is from 12-16-21, patient was seen at urgent care that same day ?

## 2021-12-21 DIAGNOSIS — H35711 Central serous chorioretinopathy, right eye: Secondary | ICD-10-CM | POA: Diagnosis not present

## 2021-12-27 DIAGNOSIS — E559 Vitamin D deficiency, unspecified: Secondary | ICD-10-CM | POA: Diagnosis not present

## 2021-12-27 DIAGNOSIS — E349 Endocrine disorder, unspecified: Secondary | ICD-10-CM | POA: Diagnosis not present

## 2021-12-27 DIAGNOSIS — Z131 Encounter for screening for diabetes mellitus: Secondary | ICD-10-CM | POA: Diagnosis not present

## 2021-12-27 DIAGNOSIS — Z1331 Encounter for screening for depression: Secondary | ICD-10-CM | POA: Diagnosis not present

## 2021-12-27 DIAGNOSIS — D539 Nutritional anemia, unspecified: Secondary | ICD-10-CM | POA: Diagnosis not present

## 2021-12-27 DIAGNOSIS — E669 Obesity, unspecified: Secondary | ICD-10-CM | POA: Diagnosis not present

## 2021-12-27 DIAGNOSIS — E7800 Pure hypercholesterolemia, unspecified: Secondary | ICD-10-CM | POA: Diagnosis not present

## 2021-12-27 DIAGNOSIS — E8881 Metabolic syndrome: Secondary | ICD-10-CM | POA: Diagnosis not present

## 2021-12-27 DIAGNOSIS — N914 Secondary oligomenorrhea: Secondary | ICD-10-CM | POA: Diagnosis not present

## 2021-12-27 DIAGNOSIS — R0602 Shortness of breath: Secondary | ICD-10-CM | POA: Diagnosis not present

## 2021-12-27 DIAGNOSIS — Z1159 Encounter for screening for other viral diseases: Secondary | ICD-10-CM | POA: Diagnosis not present

## 2021-12-27 DIAGNOSIS — R635 Abnormal weight gain: Secondary | ICD-10-CM | POA: Diagnosis not present

## 2021-12-27 DIAGNOSIS — Z79899 Other long term (current) drug therapy: Secondary | ICD-10-CM | POA: Diagnosis not present

## 2021-12-27 DIAGNOSIS — R5383 Other fatigue: Secondary | ICD-10-CM | POA: Diagnosis not present

## 2022-01-18 ENCOUNTER — Telehealth: Payer: Self-pay

## 2022-01-18 NOTE — Telephone Encounter (Signed)
Patient Name: SUMMERLYN FICKEL ?Gender: Female ?DOB: [birth year] ?Age: 36 Y 5 M 6 D ?Return Phone Number: [phone] ?[facility] - Client ?Client Site [facility] - Night ?Provider Sandford Craze - NP ?Contact Type Call ?Who Is Calling Patient / Member / Family / Caregiver ?Call Type Triage / Clinical ?Relationship To Patient Self ?Return Phone Number [phone] (Primary) ?Chief Complaint Bee, Wasp, or [place] Sting (no allergic ?symptoms) ?Reason for Call Request to Schedule Office Appointment ?Initial Comment Caller states she had a steroid prescription for an ?allergic reaction. She would like to see Melissa ?Peggyann Juba today for a telehealth appointment, ?Translation No ?Nurse Assessment ?Nurse: Debroah Loop, RN, Heather Date/Time (Eastern Time): 01/18/2022 12:19:15 PM ?Confirm and document reason for call. If ?symptomatic, describe symptoms. ?---Caller states she is having itching. Benadryl and ?claritin not helping. Has had rash/ itching for 2 days. ?

## 2022-01-18 NOTE — Telephone Encounter (Signed)
Patient complains of  skin itching, she said she was somewhat better, she will like to get a vv today. Denies any insect bite or any problems breathing. The call was lost while patient was on hold.  ?Called patient back a couple of times but no answer. Lvm for her to call back for appointment.  ?We don't have any openings in [place] but she can get a vv at [facility].  ?

## 2022-01-19 ENCOUNTER — Telehealth (INDEPENDENT_AMBULATORY_CARE_PROVIDER_SITE_OTHER): Payer: BC Managed Care – PPO | Admitting: Family Medicine

## 2022-01-19 ENCOUNTER — Encounter: Payer: Self-pay | Admitting: Family Medicine

## 2022-01-19 DIAGNOSIS — T7840XA Allergy, unspecified, initial encounter: Secondary | ICD-10-CM
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
## 2022-01-19 NOTE — Patient Instructions (Signed)
Zyrtec twice a day for the next few days, then back to once daily  ?Pepcid (famotidine) once or twice daily for the next few days  ?Gentle lotions ?Let us know if you want an allergist referral ?If severe, go to the ED  ?

## 2022-01-27 DIAGNOSIS — H35711 Central serous chorioretinopathy, right eye: Secondary | ICD-10-CM | POA: Diagnosis not present

## 2022-02-03 DIAGNOSIS — R635 Abnormal weight gain: Secondary | ICD-10-CM | POA: Diagnosis not present

## 2022-02-03 DIAGNOSIS — N914 Secondary oligomenorrhea: Secondary | ICD-10-CM | POA: Diagnosis not present

## 2022-02-03 DIAGNOSIS — E781 Pure hyperglyceridemia: Secondary | ICD-10-CM | POA: Diagnosis not present

## 2022-02-03 DIAGNOSIS — E669 Obesity, unspecified: Secondary | ICD-10-CM | POA: Diagnosis not present

## 2022-02-03 DIAGNOSIS — R7303 Prediabetes: Secondary | ICD-10-CM | POA: Diagnosis not present

## 2022-02-03 DIAGNOSIS — E8881 Metabolic syndrome: Secondary | ICD-10-CM | POA: Diagnosis not present

## 2022-03-10 DIAGNOSIS — R7303 Prediabetes: Secondary | ICD-10-CM | POA: Diagnosis not present

## 2022-03-10 DIAGNOSIS — E781 Pure hyperglyceridemia: Secondary | ICD-10-CM | POA: Diagnosis not present

## 2022-03-10 DIAGNOSIS — E669 Obesity, unspecified: Secondary | ICD-10-CM | POA: Diagnosis not present

## 2022-03-10 DIAGNOSIS — R635 Abnormal weight gain: Secondary | ICD-10-CM | POA: Diagnosis not present

## 2022-03-10 DIAGNOSIS — R739 Hyperglycemia, unspecified: Secondary | ICD-10-CM | POA: Diagnosis not present

## 2022-03-28 ENCOUNTER — Encounter (HOSPITAL_COMMUNITY): Payer: Self-pay

## 2022-03-28 ENCOUNTER — Ambulatory Visit (HOSPITAL_COMMUNITY)
Admission: EM | Admit: 2022-03-28 | Discharge: 2022-03-28 | Disposition: A | Discharge status: Home / Self Care | Payer: BC Managed Care – PPO | Attending: Emergency Medicine | Admitting: Emergency Medicine

## 2022-03-28 DIAGNOSIS — Z8679 Personal history of other diseases of the circulatory system: Secondary | ICD-10-CM | POA: Diagnosis not present

## 2022-03-28 DIAGNOSIS — Z20822 Contact with and (suspected) exposure to covid-19: Secondary | ICD-10-CM | POA: Insufficient documentation

## 2022-03-28 DIAGNOSIS — I10 Essential (primary) hypertension: Secondary | ICD-10-CM | POA: Insufficient documentation

## 2022-03-28 DIAGNOSIS — R059 Cough, unspecified: Secondary | ICD-10-CM | POA: Diagnosis not present

## 2022-03-28 DIAGNOSIS — B349 Viral infection, unspecified: Secondary | ICD-10-CM | POA: Diagnosis not present

## 2022-03-28 LAB — POCT RAPID STREP A, ED / UC: Streptococcus, Group A Screen (Direct): NEGATIVE

## 2022-03-28 MED ORDER — PROMETHAZINE-DM 6.25-15 MG/5ML PO SYRP: 5.0000 mL | ORAL_SOLUTION | Freq: Four times a day (QID) | ORAL | 0 refills | Status: DC | PRN

## 2022-03-28 NOTE — ED Triage Notes (Signed)
Patient was in [place] for a week but no confirmed sick exposure.  Onset 3 days ago fever, chills, cough, body aches, and sore throat from coughing.   Patient has been taking tylenol for fever and OTC cough medication. Last taken 2 hours ago.

## 2022-03-28 NOTE — ED Provider Notes (Signed)
[facility]    CSN: [id number] Arrival date & time: 03/28/22  1411      History   Chief Complaint Chief Complaint  Patient presents with   Cough   Fever   Chills   Generalized Body Aches    HPI Amy Francis is a 36 y.o. female.   36 year old female pt, Amy Francis, presents to urgent care with URI symptoms that started 3 days prior(cough,body aches, chills,fever), was in [facility] for a week prior to symptoms. No known illness contact,however did travel on airplane. Pt reports is COVID vaccinated.   The history is provided by the patient. No language interpreter was used.    Past Medical History:  Diagnosis Date   Anxiety    Hypertension     Patient Active Problem List   Diagnosis Date Noted   Nonspecific syndrome suggestive of viral illness 03/28/2022   History of hypertension 03/28/2022   Closed nondisplaced fracture of distal phalanx of right thumb 09/30/2021   Attention deficit 02/15/2021   Hypertension 02/15/2021   Carpal tunnel syndrome 10/14/2013   Unspecified vitamin D deficiency 07/21/2013   Anxiety state 07/14/2013   Paresthesia 07/14/2013   Atypical chest pain 07/14/2013   Obesity (BMI 30-39.9) 07/14/2013    Past Surgical History:  Procedure Laterality Date   APPENDECTOMY     CESAREAN SECTION  10/09/2009   DILATION AND EVACUATION N/A 05/03/2021   Procedure: DILATATION AND EVACUATION;  Surgeon: Carrington Clamp, MD;  Location: [facility] OR;  Service: Gynecology;  Laterality: N/A;    OB History     Gravida  1   Para  1   Term  1   Preterm  0   AB  0   Living  1      SAB  0   IAB  0   Ectopic  0   Multiple  0   Live Births               Home Medications    Prior to Admission medications   Medication Sig Start Date End Date Taking? Authorizing Provider  amLODipine (NORVASC) 10 MG tablet Take 1 tablet (10 mg total) by mouth daily. 09/30/21  Yes Sandford Craze, NP  promethazine-dextromethorphan  (PROMETHAZINE-DM) 6.25-15 MG/5ML syrup Take 5 mLs by mouth 4 (four) times daily as needed for cough. 03/28/22  Yes Sofi Bryars, Para March, NP  cyclobenzaprine (FLEXERIL) 5 MG tablet Take 1 tablet (5 mg total) by mouth 3 (three) times daily as needed for muscle spasms. 09/07/21   Cathlyn Parsons, NP  diphenhydrAMINE (BENADRYL) 25 MG tablet Take 25 mg by mouth every 6 (six) hours as needed for allergies.    [provider]  FLUoxetine (PROZAC) 10 MG capsule Take 1 capsule (10 mg total) by mouth daily. 09/30/21   Sandford Craze, NP  Multiple Vitamins-Minerals (MULTIVITAMIN WITH MINERALS) tablet Take 1 tablet by mouth daily.    [provider]    Family History Family History  Problem Relation Age of Onset   Diabetes Mother        deceased from DM complication   Hyperlipidemia Father    Cancer Other     Social History Social History   Tobacco Use   Smoking status: Never   Smokeless tobacco: Never  Vaping Use   Vaping Use: Never used  Substance Use Topics   Alcohol use: No   Drug use: No     Allergies   Patient has no known allergies.  Review of Systems Review of Systems  Constitutional:  Positive for chills and fever.  Respiratory:  Positive for cough.   Musculoskeletal:  Positive for myalgias.  All other systems reviewed and are negative.    Physical Exam Triage Vital Signs ED Triage Vitals [03/28/22 1457]  Enc Vitals Group     BP (!) 158/89     Pulse Rate 92     Resp 16     Temp 99.4 F (37.4 C)     Temp Source Oral     SpO2 100 %     Weight      Height      Head Circumference      Peak Flow      Pain Score      Pain Loc      Pain Edu?      Excl. in GC?    No data found.  Updated Vital Signs BP (!) 158/89 (BP Location: Left Arm)   Pulse 92   Temp 99.4 F (37.4 C) (Oral)   Resp 16   Ht 5\' 5"  (1.651 m)   Wt 225 lb (102.1 kg)   LMP  (LMP Unknown)   SpO2 100%   BMI 37.44 kg/m   Visual Acuity Right Eye Distance:   Left Eye  Distance:   Bilateral Distance:    Right Eye Near:   Left Eye Near:    Bilateral Near:     Physical Exam Vitals and nursing note reviewed.  Constitutional:      General: She is not in acute distress.    Appearance: She is well-developed.  HENT:     Head: Normocephalic.  Eyes:     General: Lids are normal.     Conjunctiva/sclera: Conjunctivae normal.     Pupils: Pupils are equal, round, and reactive to light.  Neck:     Trachea: Trachea normal. No tracheal deviation.  Cardiovascular:     Rate and Rhythm: Normal rate and regular rhythm.     Pulses: Normal pulses.     Heart sounds: Normal heart sounds. No murmur heard. Pulmonary:     Effort: Pulmonary effort is normal.     Breath sounds: Normal breath sounds and air entry.  Abdominal:     General: Bowel sounds are normal.     Palpations: Abdomen is soft.     Tenderness: There is no abdominal tenderness.  Musculoskeletal:        General: Normal range of motion.     Cervical back: Normal range of motion.  Lymphadenopathy:     Cervical: No cervical adenopathy.  Skin:    General: Skin is warm and dry.     Findings: No rash.  Neurological:     General: No focal deficit present.     Mental Status: She is alert and oriented to person, place, and time.     GCS: GCS eye subscore is 4. GCS verbal subscore is 5. GCS motor subscore is 6.  Psychiatric:        Attention and Perception: Attention normal.        Mood and Affect: Mood normal.        Speech: Speech normal.        Behavior: Behavior normal. Behavior is cooperative.      UC Treatments / Results  Labs (all labs ordered are listed, but only abnormal results are displayed) Labs Reviewed  SARS CORONAVIRUS 2 (TAT 6-24 HRS)  CULTURE, GROUP A STREP [facility])  POCT RAPID STREP A,  ED / UC    EKG   Radiology No results found.  Procedures Procedures (including critical care time)  Medications Ordered in UC Medications - No data to display  Initial Impression /  Assessment and Plan / UC Course  I have reviewed the triage vital signs and the nursing notes.  Pertinent labs & imaging results that were available during my care of the patient were reviewed by me and considered in my medical decision making (see chart for details).     Ddx: Viral URI w cough, seasonal allergies Final Clinical Impressions(s) / UC Diagnoses   Final diagnoses:  Nonspecific syndrome suggestive of viral illness  History of hypertension     Discharge Instructions      Rest,push fluids, take coricidin HBP for symptom management. Take cough med as directed(will make you sleepy). Quarantine until results are known, check my chart.  Go to ER for new or worsening issues or concerns     ED Prescriptions     Medication Sig Dispense Auth. Provider   promethazine-dextromethorphan (PROMETHAZINE-DM) 6.25-15 MG/5ML syrup Take 5 mLs by mouth 4 (four) times daily as needed for cough. 118 mL Esperansa Sarabia, Para March, NP      PDMP not reviewed this encounter.   Clancy Gourd, NP 03/28/22 1523

## 2022-03-28 NOTE — Discharge Instructions (Addendum)
Rest,push fluids, take coricidin HBP for symptom management. Take cough med as directed(will make you sleepy). Quarantine until results are known, check my chart.  Go to ER for new or worsening issues or concerns

## 2022-03-29 LAB — SARS CORONAVIRUS 2 (TAT 6-24 HRS): SARS Coronavirus 2: NEGATIVE

## 2022-03-30 DIAGNOSIS — Z3201 Encounter for pregnancy test, result positive: Secondary | ICD-10-CM | POA: Diagnosis not present

## 2022-03-31 LAB — CULTURE, GROUP A STREP (THRC)

## 2022-04-03 DIAGNOSIS — Z332 Encounter for elective termination of pregnancy: Secondary | ICD-10-CM | POA: Diagnosis not present

## 2022-04-03 DIAGNOSIS — Z6838 Body mass index (BMI) 38.0-38.9, adult: Secondary | ICD-10-CM | POA: Diagnosis not present

## 2022-04-04 DIAGNOSIS — N925 Other specified irregular menstruation: Secondary | ICD-10-CM | POA: Diagnosis not present

## 2022-04-04 DIAGNOSIS — Z6838 Body mass index (BMI) 38.0-38.9, adult: Secondary | ICD-10-CM | POA: Diagnosis not present

## 2022-04-06 DIAGNOSIS — Z332 Encounter for elective termination of pregnancy: Secondary | ICD-10-CM | POA: Diagnosis not present

## 2022-05-12 DIAGNOSIS — H35711 Central serous chorioretinopathy, right eye: Secondary | ICD-10-CM | POA: Diagnosis not present

## 2022-05-17 ENCOUNTER — Encounter (INDEPENDENT_AMBULATORY_CARE_PROVIDER_SITE_OTHER): Payer: Self-pay

## 2022-05-27 DIAGNOSIS — I10 Essential (primary) hypertension: Secondary | ICD-10-CM | POA: Diagnosis not present

## 2022-05-30 DIAGNOSIS — E669 Obesity, unspecified: Secondary | ICD-10-CM | POA: Diagnosis not present

## 2022-05-30 DIAGNOSIS — N914 Secondary oligomenorrhea: Secondary | ICD-10-CM | POA: Diagnosis not present

## 2022-05-30 DIAGNOSIS — E781 Pure hyperglyceridemia: Secondary | ICD-10-CM | POA: Diagnosis not present

## 2022-07-04 DIAGNOSIS — R739 Hyperglycemia, unspecified: Secondary | ICD-10-CM | POA: Diagnosis not present

## 2022-07-04 DIAGNOSIS — E8881 Metabolic syndrome: Secondary | ICD-10-CM | POA: Diagnosis not present

## 2022-07-04 DIAGNOSIS — E781 Pure hyperglyceridemia: Secondary | ICD-10-CM | POA: Diagnosis not present

## 2022-07-04 DIAGNOSIS — N914 Secondary oligomenorrhea: Secondary | ICD-10-CM | POA: Diagnosis not present

## 2022-07-04 DIAGNOSIS — E669 Obesity, unspecified: Secondary | ICD-10-CM | POA: Diagnosis not present

## 2022-07-21 ENCOUNTER — Ambulatory Visit: Payer: BC Managed Care – PPO | Admitting: Family

## 2022-08-03 DIAGNOSIS — E559 Vitamin D deficiency, unspecified: Secondary | ICD-10-CM | POA: Diagnosis not present

## 2022-08-03 DIAGNOSIS — E669 Obesity, unspecified: Secondary | ICD-10-CM | POA: Diagnosis not present

## 2022-08-03 DIAGNOSIS — R635 Abnormal weight gain: Secondary | ICD-10-CM | POA: Diagnosis not present

## 2022-08-03 DIAGNOSIS — E781 Pure hyperglyceridemia: Secondary | ICD-10-CM | POA: Diagnosis not present

## 2022-08-03 DIAGNOSIS — R7303 Prediabetes: Secondary | ICD-10-CM | POA: Diagnosis not present

## 2022-08-11 ENCOUNTER — Ambulatory Visit: Payer: BC Managed Care – PPO | Admitting: Family

## 2022-08-11 ENCOUNTER — Telehealth: Payer: Self-pay | Admitting: Family

## 2022-08-11 ENCOUNTER — Other Ambulatory Visit: Payer: Self-pay

## 2022-08-11 MED ORDER — AMLODIPINE BESYLATE 10 MG PO TABS: 10.0000 mg | ORAL_TABLET | Freq: Every day | ORAL | 0 refills | Status: DC

## 2022-08-11 NOTE — Telephone Encounter (Signed)
One 30 day refill was sent, patient advised to keep appointment for next week

## 2022-08-11 NOTE — Telephone Encounter (Signed)
Medication: amLODipine (NORVASC) 10 MG tablet    Preferred Pharmacy (with phone number or street name): [address], [place], [place] [address]  [phone]  Agent: Please be advised that RX refills may take up to 3 business days. We ask that you follow-up with your pharmacy.    Please call patient when refill sent. She missed appt for blood pressure check and is out of pills so wants a refill to hold her over until her appt.

## 2022-08-16 ENCOUNTER — Ambulatory Visit: Payer: BC Managed Care – PPO | Admitting: Family

## 2022-08-28 ENCOUNTER — Ambulatory Visit: Payer: BC Managed Care – PPO | Admitting: Family

## 2022-09-14 DIAGNOSIS — R739 Hyperglycemia, unspecified: Secondary | ICD-10-CM | POA: Diagnosis not present

## 2022-09-14 DIAGNOSIS — R7303 Prediabetes: Secondary | ICD-10-CM | POA: Diagnosis not present

## 2022-09-14 DIAGNOSIS — E781 Pure hyperglyceridemia: Secondary | ICD-10-CM | POA: Diagnosis not present

## 2022-09-14 DIAGNOSIS — R635 Abnormal weight gain: Secondary | ICD-10-CM | POA: Diagnosis not present

## 2022-09-14 DIAGNOSIS — E669 Obesity, unspecified: Secondary | ICD-10-CM | POA: Diagnosis not present

## 2022-10-04 ENCOUNTER — Telehealth: Payer: Self-pay

## 2022-10-04 NOTE — Telephone Encounter (Signed)
Pt is asking to [facility] from [place] to Dr. Okey Dupre due to the distance of traveling and transportation. Pt states the [facility] location is a better option to seek care as it is easier for her to travel to and much closer for her.

## 2022-10-06 NOTE — Telephone Encounter (Signed)
Ok for [facility] when available. Keep care with PCP until [facility] visit.

## 2022-10-24 ENCOUNTER — Telehealth: Payer: BC Managed Care – PPO | Admitting: Family

## 2022-10-24 MED ORDER — AMLODIPINE BESYLATE 10 MG PO TABS: 10.0000 mg | ORAL_TABLET | Freq: Every day | ORAL | 0 refills | Status: DC

## 2022-10-24 NOTE — Progress Notes (Signed)
No charge.  Visit was cancelled because pt was following up on her blood pressure. Last visit >1 year ago.  Did not have updated blood pressure readings available.  Recommended that she reschedule an in person visit.  Pt states that she will be transferring her care to Dr. Sharlet Salina at our [facility] location and is advised to call their office to schedule.

## 2022-12-07 DIAGNOSIS — R7303 Prediabetes: Secondary | ICD-10-CM | POA: Diagnosis not present

## 2022-12-07 DIAGNOSIS — E669 Obesity, unspecified: Secondary | ICD-10-CM | POA: Diagnosis not present

## 2022-12-07 DIAGNOSIS — N914 Secondary oligomenorrhea: Secondary | ICD-10-CM | POA: Diagnosis not present

## 2022-12-07 DIAGNOSIS — E559 Vitamin D deficiency, unspecified: Secondary | ICD-10-CM | POA: Diagnosis not present

## 2022-12-07 DIAGNOSIS — E781 Pure hyperglyceridemia: Secondary | ICD-10-CM | POA: Diagnosis not present

## 2022-12-08 DIAGNOSIS — Z309 Encounter for contraceptive management, unspecified: Secondary | ICD-10-CM | POA: Diagnosis not present

## 2022-12-22 DIAGNOSIS — Z30430 Encounter for insertion of intrauterine contraceptive device: Secondary | ICD-10-CM | POA: Diagnosis not present

## 2023-01-04 DIAGNOSIS — E669 Obesity, unspecified: Secondary | ICD-10-CM | POA: Diagnosis not present

## 2023-01-04 DIAGNOSIS — R7303 Prediabetes: Secondary | ICD-10-CM | POA: Diagnosis not present

## 2023-01-04 DIAGNOSIS — N914 Secondary oligomenorrhea: Secondary | ICD-10-CM | POA: Diagnosis not present

## 2023-01-04 DIAGNOSIS — E781 Pure hyperglyceridemia: Secondary | ICD-10-CM | POA: Diagnosis not present

## 2023-01-04 DIAGNOSIS — E559 Vitamin D deficiency, unspecified: Secondary | ICD-10-CM | POA: Diagnosis not present

## 2023-02-06 DIAGNOSIS — E559 Vitamin D deficiency, unspecified: Secondary | ICD-10-CM | POA: Diagnosis not present

## 2023-02-06 DIAGNOSIS — E669 Obesity, unspecified: Secondary | ICD-10-CM | POA: Diagnosis not present

## 2023-02-06 DIAGNOSIS — R7303 Prediabetes: Secondary | ICD-10-CM | POA: Diagnosis not present

## 2023-02-06 DIAGNOSIS — E781 Pure hyperglyceridemia: Secondary | ICD-10-CM | POA: Diagnosis not present

## 2023-02-06 DIAGNOSIS — R635 Abnormal weight gain: Secondary | ICD-10-CM | POA: Diagnosis not present

## 2023-02-26 ENCOUNTER — Encounter: Payer: Self-pay | Admitting: Internal Medicine

## 2023-02-26 ENCOUNTER — Ambulatory Visit (INDEPENDENT_AMBULATORY_CARE_PROVIDER_SITE_OTHER): Payer: BC Managed Care – PPO | Admitting: Internal Medicine

## 2023-02-26 VITALS — BP 120/100 | HR 68 | Temp 98.5°F | Ht 65.0 in | Wt 221.0 lb

## 2023-02-26 DIAGNOSIS — I10 Essential (primary) hypertension: Secondary | ICD-10-CM

## 2023-02-26 DIAGNOSIS — F411 Generalized anxiety disorder: Secondary | ICD-10-CM

## 2023-02-26 NOTE — Progress Notes (Signed)
   Subjective:   Patient ID: Amy Francis, female    DOB: [birth year], 37 y.o.   MRN: [id number]  HPI The patient is a 37 YO female coming in for transfer of care/ongoing care see A/P for details.   PMH, [facility], social history reviewed and updated  Review of Systems  Constitutional: Negative.   HENT: Negative.    Eyes: Negative.   Respiratory:  Negative for cough, chest tightness and shortness of breath.   Cardiovascular:  Negative for chest pain, palpitations and leg swelling.  Gastrointestinal:  Negative for abdominal distention, abdominal pain, constipation, diarrhea, nausea and vomiting.  Musculoskeletal: Negative.   Skin: Negative.   Neurological: Negative.   Psychiatric/Behavioral:  The patient is nervous/anxious.     Objective:  Physical Exam Constitutional:      Appearance: She is well-developed.  HENT:     Head: Normocephalic and atraumatic.  Cardiovascular:     Rate and Rhythm: Normal rate and regular rhythm.  Pulmonary:     Effort: Pulmonary effort is normal. No respiratory distress.     Breath sounds: Normal breath sounds. No wheezing or rales.  Abdominal:     General: Bowel sounds are normal. There is no distension.     Palpations: Abdomen is soft.     Tenderness: There is no abdominal tenderness. There is no rebound.  Musculoskeletal:     Cervical back: Normal range of motion.  Skin:    General: Skin is warm and dry.  Neurological:     Mental Status: She is alert and oriented to person, place, and time.     Coordination: Coordination normal.     Vitals:   02/26/23 0845 02/26/23 0944  BP: (!) 140/100 (!) 120/100  Pulse: 68   Temp: 98.5 F (36.9 C)   TempSrc: Oral   Weight: 221 lb (100.2 kg)   Height: 5\' 5"  (1.651 m)     Assessment & Plan:  Visit time 25 minutes in face to face communication with patient and coordination of care, additional 10 minutes spent in record review, coordination or care, ordering tests, communicating/referring to other  healthcare professionals, documenting in medical records all on the same day of the visit for total time 35 minutes spent on the visit.

## 2023-02-26 NOTE — Assessment & Plan Note (Signed)
Using lifestyle changes to help and overall satisfied with control. No medication needed and did not take prozac prescribed a few years ago.

## 2023-02-26 NOTE — Assessment & Plan Note (Signed)
BP is elevated moderately today. She is aware and has been off medication for some time due to lack of visit. She wishes to make lifestyle changes and states BP at home is lower than here. She will follow up 3 months. If high at that time she will start on medication at that time.
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
## 2023-06-08 DIAGNOSIS — Z01411 Encounter for gynecological examination (general) (routine) with abnormal findings: Secondary | ICD-10-CM | POA: Diagnosis not present

## 2023-06-08 DIAGNOSIS — N898 Other specified noninflammatory disorders of vagina: Secondary | ICD-10-CM | POA: Diagnosis not present

## 2023-06-08 DIAGNOSIS — Z01419 Encounter for gynecological examination (general) (routine) without abnormal findings: Secondary | ICD-10-CM | POA: Diagnosis not present

## 2023-06-08 DIAGNOSIS — Z833 Family history of diabetes mellitus: Secondary | ICD-10-CM | POA: Diagnosis not present

## 2023-06-08 DIAGNOSIS — R030 Elevated blood-pressure reading, without diagnosis of hypertension: Secondary | ICD-10-CM | POA: Diagnosis not present

## 2023-06-08 DIAGNOSIS — Z113 Encounter for screening for infections with a predominantly sexual mode of transmission: Secondary | ICD-10-CM | POA: Diagnosis not present

## 2023-06-15 ENCOUNTER — Encounter: Payer: Self-pay | Admitting: Pharmacist

## 2023-06-20 ENCOUNTER — Ambulatory Visit (INDEPENDENT_AMBULATORY_CARE_PROVIDER_SITE_OTHER): Payer: BC Managed Care – PPO | Admitting: Internal Medicine

## 2023-06-20 ENCOUNTER — Encounter: Payer: Self-pay | Admitting: Internal Medicine

## 2023-06-20 VITALS — BP 150/88 | HR 86 | Temp 98.3°F | Ht 65.0 in | Wt 230.0 lb

## 2023-06-20 DIAGNOSIS — F411 Generalized anxiety disorder: Secondary | ICD-10-CM | POA: Diagnosis not present

## 2023-06-20 DIAGNOSIS — I10 Essential (primary) hypertension: Secondary | ICD-10-CM

## 2023-06-20 DIAGNOSIS — R7303 Prediabetes: Secondary | ICD-10-CM | POA: Insufficient documentation

## 2023-06-20 MED ORDER — TRULICITY 0.75 MG/0.5ML ~~LOC~~ SOAJ: 0.7500 mg | SUBCUTANEOUS | 0 refills | Status: DC

## 2023-06-20 MED ORDER — TRULICITY 3 MG/0.5ML ~~LOC~~ SOAJ: 3.0000 mg | SUBCUTANEOUS | 5 refills | Status: DC

## 2023-06-20 MED ORDER — BUPROPION HCL ER (XL) 150 MG PO TB24: 150.0000 mg | ORAL_TABLET | Freq: Every day | ORAL | 1 refills | Status: DC

## 2023-06-20 MED ORDER — TRULICITY 1.5 MG/0.5ML ~~LOC~~ SOAJ: 1.5000 mg | SUBCUTANEOUS | 0 refills | Status: DC

## 2023-06-20 NOTE — Progress Notes (Signed)
   Subjective:   Patient ID: Amy Francis, female    DOB: [birth year], 37 y.o.   MRN: [id number]  HPI The patient is a 37 YO female coming in for BP follow up/anxiety follow up. Moderately high at last visit and she was attempting to make lifestyle changes. Was taking tyulicity and stopped. HgA1c 6.1 recently. She would like to resume. Never had diabetes was taking trulicity for pre-diabetes only. She is also wanting to try something for anxiety she has struggled with this on her own for some time and not able to sustain progress. Recent EKG with weight loss clinic as well.   Review of Systems  Constitutional: Negative.   HENT: Negative.    Eyes: Negative.   Respiratory:  Negative for cough, chest tightness and shortness of breath.   Cardiovascular:  Negative for chest pain, palpitations and leg swelling.  Gastrointestinal:  Negative for abdominal distention, abdominal pain, constipation, diarrhea, nausea and vomiting.  Musculoskeletal: Negative.   Skin: Negative.   Neurological: Negative.   Psychiatric/Behavioral:  Positive for sleep disturbance. The patient is nervous/anxious.     Objective:  Physical Exam Constitutional:      Appearance: She is well-developed.  HENT:     Head: Normocephalic and atraumatic.  Cardiovascular:     Rate and Rhythm: Normal rate and regular rhythm.  Pulmonary:     Effort: Pulmonary effort is normal. No respiratory distress.     Breath sounds: Normal breath sounds. No wheezing or rales.  Abdominal:     General: Bowel sounds are normal. There is no distension.     Palpations: Abdomen is soft.     Tenderness: There is no abdominal tenderness. There is no rebound.  Musculoskeletal:     Cervical back: Normal range of motion.  Skin:    General: Skin is warm and dry.  Neurological:     Mental Status: She is alert and oriented to person, place, and time.     Coordination: Coordination normal.     Vitals:   06/20/23 1016 06/20/23 1022  BP: (!)  150/88 (!) 150/88  Pulse: 86   Temp: 98.3 F (36.8 C)   TempSrc: Oral   SpO2: 97%   Weight: 230 lb (104.3 kg)   Height: 5\' 5"  (1.651 m)    Recent EKG reviewed and some PVC, normal sinus, no axis problems or interval problems, rate 122 sinus tachy, no st or t wave changes.   Assessment & Plan:

## 2023-06-20 NOTE — Assessment & Plan Note (Signed)
She is struggling with this and we will start wellbutrin 150 mg daily which can help with weight concurrently. Follow up with visit or message 4-6 weeks.

## 2023-06-20 NOTE — Assessment & Plan Note (Signed)
Outside labs with gyn HgA1c 6.1. She was previously on trulicity and never diabetic. Prescribing trulicity and informed her that this is not approved for pre-diabetes and may be denied by her insurance company since she does not have diabetes.

## 2023-06-20 NOTE — Assessment & Plan Note (Signed)
Reviewed outside EKG which is normal without signs of LVH. Will start medication for anxiety and if BP persistently high will then add BP medication. She understands and agrees with this plan.

## 2023-06-20 NOTE — Assessment & Plan Note (Addendum)
BMI 38 and complicated by hypertension and anxiety and pre-diabetes. We are starting trulicity to help.

## 2023-06-20 NOTE — Patient Instructions (Signed)
We have sent in wellbutrin to take 1 pill daily. Let us know in 1 month how this is doing.  We have sent in trulicity to start with 0.75 mg weekly for month 1 then increase to 1.5 mg weekly for month 2 then increase to 3 mg weekly for month 3 and onward.

## 2023-06-21 ENCOUNTER — Telehealth: Payer: Self-pay

## 2023-06-21 ENCOUNTER — Other Ambulatory Visit (HOSPITAL_COMMUNITY): Payer: Self-pay

## 2023-06-21 NOTE — Telephone Encounter (Signed)
Pharmacy Patient Advocate Encounter   Received notification from Physician's Office that prior authorization for TRULICITY is required/requested.   Insurance verification completed.   The patient is insured through [facility] .   Per test claim: PA required; PA submitted to [employer] via CoverMyMeds Key/confirmation #/[id number]     Status is pending

## 2023-06-28 ENCOUNTER — Telehealth: Payer: Self-pay | Admitting: Internal Medicine

## 2023-06-28 NOTE — Telephone Encounter (Signed)
Pharmacy Patient Advocate Encounter  Received notification from [facility] that Prior Authorization for TRULICITY 0.75MG /0.5ML has been DENIED.  See denial reason below. No denial letter attached in CMM. Will attache denial letter to Media tab once received.   PA #/Case ID/Reference #: [id number]

## 2023-06-28 NOTE — Telephone Encounter (Signed)
Patient has see this and I will close this encounter

## 2023-06-28 NOTE — Telephone Encounter (Signed)
Pt wanting the nurse to give her a called about Dulaglutide (TRULICITY) 3 MG/0.5ML SOPN. Advise pt it was denied. Please advise [phone]

## 2023-07-02 NOTE — Telephone Encounter (Signed)
Pt states that she was taking this Diethylpropion and Trulicity together but since she was denied for Trulicity she wanted to know if she can take  Diethylpropion again?

## 2023-07-03 NOTE — Telephone Encounter (Signed)
Spoke with patient and informed her about this

## 2023-07-03 NOTE — Telephone Encounter (Signed)
No, that medication is not meant to be used longer than 1-2 weeks and has a lifetime maximum safe duration. I do not recommend it.

## 2023-07-19 ENCOUNTER — Telehealth: Payer: Self-pay | Admitting: Internal Medicine

## 2023-07-19 NOTE — Telephone Encounter (Signed)
Patient states that her insurance will not cover Ozempic and wants to know if you can change her medication to Semaglutide ?

## 2023-07-19 NOTE — Telephone Encounter (Signed)
Patient called and said she would like to talk about changing her medication to something else. She said she couldn't remember what the medication was called and that she would call back with the name. She would like a call back at [phone].

## 2023-07-23 NOTE — Telephone Encounter (Signed)
We did prescribe her trulicity which was not covered. Ozempic is semaglutide but this does not come in a generic. Is she asking for prescription for ozempic?

## 2023-07-24 ENCOUNTER — Other Ambulatory Visit: Payer: Self-pay | Admitting: Pharmacist

## 2023-07-24 NOTE — Telephone Encounter (Signed)
Can you please give patient a call go over options with her for weight loss medication.

## 2023-07-24 NOTE — Progress Notes (Signed)
Called patient to discuss options for weight loss medication as requested by PCP's CMA.  Trulicity was previously sent in but was denied, likely due to pt not having diagnosis of diabetes. Options for Rx weight loss meds will be Wegovy or Zepbound. Pt asks for me to call back on 11/1 when her insurance kicks back in to try sending in an Rx for either of these medications.  Arbutus Leas, PharmD, BCPS [facility] [phone]

## 2023-08-10 ENCOUNTER — Other Ambulatory Visit: Payer: BC Managed Care – PPO

## 2023-08-13 ENCOUNTER — Other Ambulatory Visit: Payer: BC Managed Care – PPO | Admitting: Pharmacist

## 2023-08-13 ENCOUNTER — Other Ambulatory Visit: Payer: Self-pay

## 2023-08-13 DIAGNOSIS — L089 Local infection of the skin and subcutaneous tissue, unspecified: Secondary | ICD-10-CM | POA: Diagnosis not present

## 2023-08-13 DIAGNOSIS — L658 Other specified nonscarring hair loss: Secondary | ICD-10-CM | POA: Diagnosis not present

## 2023-08-13 MED ORDER — TIRZEPATIDE-WEIGHT MANAGEMENT 2.5 MG/0.5ML ~~LOC~~ SOAJ
2.5000 mg | SUBCUTANEOUS | 0 refills | Status: DC
  Filled 2023-08-13 (×2): qty 2, 28d supply, fill #0

## 2023-08-13 NOTE — Progress Notes (Unsigned)
   08/13/2023 Name: Amy Francis MRN: [id number] DOB: 04/10/1986  Chief Complaint  Patient presents with   Weight Loss    Amy Francis is a 37 y.o. year old female who presented for a telephone visit.   They were referred to the pharmacist by their PCP for assistance in managing medication access and weight loss.  1 hour exercise 5 days per week, eating 1800 calories per day  Subjective:  Care Team: Primary Care Provider: Myrlene Broker, MD ; Next Scheduled Visit: not scheduled  Medication Access/Adherence  Current Pharmacy:  [facility] [place] #[id number] [place] Otto, [place] - [address] AT [facility] OF [address] [place] [address] Phone: [phone] Fax: [phone]  [facility] #[id number] Ginette Otto, [place] - [address] AT [facility] OF [facility] [address] [place] [address] Phone: [phone] Fax: [phone]   Patient reports affordability concerns with their medications: Yes  Patient reports access/transportation concerns to their pharmacy: No  Patient reports adherence concerns with their medications:  No    Pt would like a GLP-1 for weight loss but has been unable to get one that her insurance will cover.   Obesity/Overweight, Complicated by ***:  Current medications: none Trulicity previously prescribed but not covered  Pt recently has insurance again through work and would like to try to get a medication for weight loss.  [employer]  ID [id number] Rx BIN [id number] PCN [id number] Group [id number]   Objective:  Lab Results  Component Value Date   HGBA1C 5.8 01/03/2017    Lab Results  Component Value Date   CREATININE 0.84 05/03/2021   BUN 7 05/03/2021   NA 135 05/03/2021   K 3.9 05/03/2021   CL 103 05/03/2021   CO2 26 05/03/2021    Lab Results  Component Value Date   CHOL 167 11/20/2018   HDL 50.10 11/20/2018   LDLCALC 87 11/20/2018   TRIG 146.0 11/20/2018    CHOLHDL 3 11/20/2018    Medications Reviewed Today   Medications were not reviewed in this encounter       Assessment/Plan:   Obesity/Overweight: - Trulicity previously not covered because it, along with Ozempic, will require diagnosis of diabetes. - Recommend Wegovy or Zepbound - depends what her insurance formulary prefers. Will send in Rx for Zepbound to [facility]  Follow Up Plan: 4 weeks after starting medication  Arbutus Leas, PharmD, BCPS Clinical Pharmacist [place] [facility] at [facility] [phone]

## 2023-08-15 NOTE — Patient Instructions (Signed)
It was a pleasure speaking with you today!  The prior authorization I completed for Zepbound was denied by your insurance because your plan does not cover weight loss medications such as Zepbound or [id number].  One option you may try would be metformin ER. Let us know if you would like to try the metformin ER.  Feel free to call with any questions or concerns!  Arbutus Leas, PharmD, BCPS [facility] Clinical Pharmacist [facility] [phone]

## 2023-08-20 ENCOUNTER — Telehealth: Payer: Self-pay

## 2023-08-20 NOTE — Telephone Encounter (Signed)
Pharmacy Patient Advocate Encounter  Received notification from [facility] that Prior Authorization for ZEPBOUND has been DENIED.  Full denial letter will be uploaded to the media tab. See denial reason below.   DENIAL REASON: Plan exclusion

## 2023-08-24 ENCOUNTER — Other Ambulatory Visit: Payer: Self-pay

## 2023-08-28 ENCOUNTER — Telehealth: Payer: Self-pay | Admitting: Internal Medicine

## 2023-08-28 ENCOUNTER — Other Ambulatory Visit: Payer: Self-pay | Admitting: Pharmacist

## 2023-08-28 DIAGNOSIS — R7303 Prediabetes: Secondary | ICD-10-CM

## 2023-08-28 MED ORDER — METFORMIN HCL ER 750 MG PO TB24
ORAL_TABLET | ORAL | 0 refills | Status: AC
Start: 2023-08-28 — End: ?

## 2023-08-28 NOTE — Progress Notes (Signed)
   08/28/2023 Name: ALIKI SANTIZO MRN: [id number] DOB: [birth year]  Chief Complaint  Patient presents with   Medication Management   Weight Loss    JENAVECIA FEIERTAG is a 37 y.o. year old female who presented for a telephone visit.   They were referred to the pharmacist by their PCP for assistance in managing medication access and weight loss.    Subjective:  Care Team: Primary Care Provider: Myrlene Broker, MD ; Next Scheduled Visit: not scheduled  Medication Access/Adherence  Current Pharmacy:  [facility] #[id number] Ginette Otto, [place] - [address] AT [facility] OF [address] [place] [address] Phone: [phone] Fax: [phone]  [facility] #[id number] Ginette Otto, [place] - [address] AT [facility] OF [facility] & [address] [place] [address] Phone: [phone] Fax: [phone]  [facility] - [facility] [address] [place] [address] Phone: [phone] Fax: [phone]   Patient reports affordability concerns with their medications: Yes  Patient reports access/transportation concerns to their pharmacy: No  Patient reports adherence concerns with their medications:  No     Obesity/Overweight, Complicated by hypertension, prediabetes:  Current medications: none Tried to get Zepbound/Wegovy, however pt's insurance does not cover either   Lifestyle modifications: 1 hour exercise 5 days per week, eating 1800 calories per day for >12 months  Objective:  Lab Results  Component Value Date   HGBA1C 5.8 01/03/2017    Lab Results  Component Value Date   CREATININE 0.84 05/03/2021   BUN 7 05/03/2021   NA 135 05/03/2021   K 3.9 05/03/2021   CL 103 05/03/2021   CO2 26 05/03/2021    Lab Results  Component Value Date   CHOL 167 11/20/2018   HDL 50.10 11/20/2018   LDLCALC 87 11/20/2018   TRIG 146.0 11/20/2018    CHOLHDL 3 11/20/2018    Medications Reviewed Today   Medications were not reviewed in this encounter       Assessment/Plan:   Obesity/Overweight: Suggested trial of metformin ER, as discussed with Dr. Okey Dupre.  Recommend metformin ER 750 mg ER 1 tablet once daily x2 weeks then can increase to 2 tablets daily if tolerating Reviewed possible side effects  Follow Up Plan: 3 months telephone call  Arbutus Leas, PharmD, BCPS Clinical Pharmacist [place] [facility] at [facility] [phone]

## 2023-08-28 NOTE — Patient Instructions (Addendum)
It was a pleasure speaking with you today!  We will try starting metformin ER 750 mg once daily. You can increase to 2 tablets daily after 2 weeks if you tolerate the medication.  I will follow up with you in a few months.   Feel free to call with any questions or concerns!  Arbutus Leas, PharmD, BCPS [facility] Clinical Pharmacist [facility] [phone]

## 2023-08-28 NOTE — Telephone Encounter (Signed)
Returned patient call, see Patient Outreach encounter from today   Arbutus Leas, PharmD, BCPS Clinical Pharmacist [place] [facility] at [facility] [phone]

## 2023-08-28 NOTE — Telephone Encounter (Signed)
Patient called and requested to speak with Amy Francis regarding her appointment on 08/13/2023. She would like a call back at [phone].

## 2023-09-12 ENCOUNTER — Telehealth: Payer: Self-pay | Admitting: Internal Medicine

## 2023-09-12 ENCOUNTER — Other Ambulatory Visit: Payer: Self-pay

## 2023-09-12 NOTE — Telephone Encounter (Signed)
Returned patient's call. She states she got new insurance through her work and would like to see if she can get Zepbound now with her new insurance. Message sent to [facility] pharmacy to see if it can be sent to insurance again.  Received response from pharmacy stating Zepbound was rejected by insurance with denial stating weight loss medications aren't covered. Informed patient and recommended she contact her insurance company regarding her formulary and if there are any exceptions or eligibility criteria for GLP-1 agonists for weight loss.  Arbutus Leas, PharmD, BCPS, CPP Clinical Pharmacist Practitioner [place] [facility] at [facility] [phone]

## 2023-09-12 NOTE — Telephone Encounter (Signed)
Patient called and requested to speak with Miguel Dibble. She declined to say what she needed, just that it was about a prescription. Patient would like a call back at [phone].

## 2023-10-29 ENCOUNTER — Encounter: Payer: Self-pay | Admitting: Internal Medicine

## 2023-10-29 ENCOUNTER — Telehealth (INDEPENDENT_AMBULATORY_CARE_PROVIDER_SITE_OTHER): Payer: Self-pay | Admitting: Internal Medicine

## 2023-10-29 DIAGNOSIS — M542 Cervicalgia: Secondary | ICD-10-CM | POA: Insufficient documentation

## 2023-10-29 MED ORDER — CYCLOBENZAPRINE HCL 5 MG PO TABS
5.0000 mg | ORAL_TABLET | Freq: Three times a day (TID) | ORAL | 1 refills | Status: AC | PRN
Start: 2023-10-29 — End: ?

## 2023-10-29 MED ORDER — PREDNISONE 20 MG PO TABS: 40.0000 mg | ORAL_TABLET | Freq: Every day | ORAL | 0 refills | Status: DC

## 2023-10-29 NOTE — Progress Notes (Signed)
Virtual Visit via Video Note  I connected with Amy Francis on 10/29/23 at  4:00 PM EST by a video enabled telemedicine application and verified that I am speaking with the correct person using two identifiers.  The patient and the provider were at separate locations throughout the entire encounter. Patient location: home, Provider location: work   I discussed the limitations of evaluation and management by telemedicine and the availability of in person appointments. The patient expressed understanding and agreed to proceed. The patient and the provider were the only parties present for the visit unless noted in HPI below.  History of Present Illness: The patient is a 38 y.o. female with visit for neck pain for a few days. Using heating pad and some otc medications ibuprofen and tylenol which did help temporarily. No known trauma or injury.  Observations/Objective: Appearance: normal, breathing appears normal, casual grooming, mental status is A and O times 3  Assessment and Plan: See problem oriented charting  Follow Up Instructions: Rx flexeril 5 mg TID prn for pain and prednisone 40 mg 5 day course  I discussed the assessment and treatment plan with the patient. The patient was provided an opportunity to ask questions and all were answered. The patient agreed with the plan and demonstrated an understanding of the instructions.   The patient was advised to call back or seek an in-person evaluation if the symptoms worsen or if the condition fails to improve as anticipated.  Myrlene Broker, MD

## 2023-10-29 NOTE — Assessment & Plan Note (Signed)
Rx prednisone 5 day course and flexeril 5 mg TID prn for pain. Given stretching exercises. No red flag signs.

## 2023-11-20 ENCOUNTER — Other Ambulatory Visit (INDEPENDENT_AMBULATORY_CARE_PROVIDER_SITE_OTHER): Payer: Self-pay | Admitting: Pharmacist

## 2023-11-20 DIAGNOSIS — R7303 Prediabetes: Secondary | ICD-10-CM

## 2023-11-20 NOTE — Progress Notes (Signed)
11/20/2023 Name: Amy Francis MRN: [id number] DOB: June 12, 1986  Chief Complaint  Patient presents with   Weight loss    Amy Francis is a 38 y.o. year old female who presented for a telephone visit.   They were referred to the pharmacist by their PCP for assistance in managing medication access and weight loss.    Subjective:  Care Team: Primary Care Provider: Myrlene Broker, MD ; Next Scheduled Visit: not scheduled  Medication Access/Adherence  Current Pharmacy:  [facility] #[id number] Ginette Otto, [place] - [address] AT [facility] OF [address] [place] [address] Phone: [phone] Fax: [phone]  [facility]ORE #[id number] Ginette Otto,  - [address] AT [facility] OF [facility] & [address] [place] [address] Phone: [phone] Fax: [phone]  [facility] - [facility] [address] [place] [address] Phone: [phone] Fax: [phone]   Patient reports affordability concerns with their medications: Yes  Patient reports access/transportation concerns to their pharmacy: No  Patient reports adherence concerns with their medications:  No     Obesity/Overweight, Complicated by hypertension, prediabetes:  Current medications: none Tried to get Zepbound/Wegovy, however pt's insurance does not cover either  Sent in metformin ER back in November, however pt states she did not start it. States she will plan to contact the pharmacy today to get it filled.   Lifestyle modifications: 1 hour exercise 5 days per week, eating 1800 calories per day for >12 months  Objective:  Lab Results  Component Value Date   HGBA1C 5.8 01/03/2017    Lab Results  Component Value Date   CREATININE 0.84 05/03/2021   BUN 7 05/03/2021   NA 135 05/03/2021   K 3.9 05/03/2021   CL 103 05/03/2021   CO2 26 05/03/2021    Lab Results   Component Value Date   CHOL 167 11/20/2018   HDL 50.10 11/20/2018   LDLCALC 87 11/20/2018   TRIG 146.0 11/20/2018   CHOLHDL 3 11/20/2018    Medications Reviewed Today     Reviewed by Bonita Quin, RPH (Pharmacist) on 11/20/23 at 1004  Med List Status: <None>   Medication Order Taking? Sig Documenting Provider Last Dose Status Informant  buPROPion (WELLBUTRIN XL) 150 MG 24 hr tablet [id number]  Take 1 tablet (150 mg total) by mouth daily. Myrlene Broker, MD  Active   cyclobenzaprine (FLEXERIL) 5 MG tablet [id number]  Take 1 tablet (5 mg total) by mouth 3 (three) times daily as needed for muscle spasms. Myrlene Broker, MD  Active   diphenhydrAMINE (BENADRYL) 25 MG tablet [id number]  Take 25 mg by mouth every 6 (six) hours as needed for allergies. [provider]  Active Self  metFORMIN (GLUCOPHAGE-XR) 750 MG 24 hr tablet [id number] No Take 1 tablet once daily with breakfast for 2 weeks then increase to 2 tablets once daily with breakfast  Patient not taking: Reported on 11/20/2023   Myrlene Broker, MD Not Taking Active   Multiple Vitamins-Minerals (MULTIVITAMIN WITH MINERALS) tablet [id number]  Take 1 tablet by mouth daily. [provider]  Active Self  predniSONE (DELTASONE) 20 MG tablet [id number]  Take 2 tablets (40 mg total) by mouth daily with breakfast. Myrlene Broker, MD  Active               Assessment/Plan:   Obesity/Overweight: Suggested trial of metformin ER, as  discussed with Dr. Okey Dupre.  Recommend metformin ER 750 mg ER 1 tablet once daily x2 weeks then can increase to 2 tablets daily if tolerating   Follow Up Plan: PRN  Arbutus Leas, PharmD, BCPS Clinical Pharmacist [place] [facility] at [facility] [phone]

## 2024-03-04 ENCOUNTER — Other Ambulatory Visit: Payer: Self-pay

## 2024-03-04 ENCOUNTER — Other Ambulatory Visit: Payer: Self-pay | Admitting: Internal Medicine

## 2024-03-04 DIAGNOSIS — F411 Generalized anxiety disorder: Secondary | ICD-10-CM

## 2024-03-04 MED ORDER — BUPROPION HCL ER (XL) 150 MG PO TB24: 150.0000 mg | ORAL_TABLET | Freq: Every day | ORAL | 1 refills | Status: DC

## 2024-06-24 ENCOUNTER — Encounter: Payer: Self-pay | Admitting: Emergency Medicine

## 2024-06-24 ENCOUNTER — Telehealth: Payer: Self-pay | Admitting: Emergency Medicine

## 2024-06-24 DIAGNOSIS — L239 Allergic contact dermatitis, unspecified cause: Secondary | ICD-10-CM

## 2024-06-24 MED ORDER — METHYLPREDNISOLONE 4 MG PO TBPK: ORAL_TABLET | ORAL | 1 refills | Status: DC

## 2024-06-24 NOTE — Progress Notes (Signed)
 Telemedicine Encounter- SOAP NOTE Established Patient MyChart video encounter Patient: Home  Provider: Office   Patient present only  This video encounter was conducted with the patient's (or proxy's) verbal consent via video telecommunications: yes/no: Yes Patient was instructed to have this encounter in a suitably private space; and to only have persons present to whom they give permission to participate. In addition, patient identity was confirmed by use of name plus two identifiers (DOB and address).  Chief Complaint  Patient presents with   Rash    Patient states she's had this before. Her skin is red in the area where she scratches. She believes its something she is allergic. She put benadryl on it and did not help    Subjective  Amy Francis is a 38 y.o. established patient.  Visit today complaining of itchy diffuse skin rash started a couple days ago Has had similar outbreaks in the past.  Medrol  Dosepak helped in the past No other associated symptoms No other complaints or medical concerns today.  Rash Pertinent negatives include no congestion, cough, diarrhea, fever, shortness of breath, sore throat or vomiting.   ? Patient Active Problem List   Diagnosis Date Noted   Neck pain 10/29/2023   Pre-diabetes 06/20/2023   Attention deficit 02/15/2021   Hypertension 02/15/2021   Vitamin D deficiency 07/21/2013   Anxiety state 07/14/2013   Morbid obesity (HCC) 07/14/2013   Past Medical History:  Diagnosis Date   Anxiety    Hypertension    Current Outpatient Medications  Medication Sig Dispense Refill   buPROPion  (WELLBUTRIN  XL) 150 MG 24 hr tablet TAKE 1 TABLET(150 MG) BY MOUTH DAILY 90 tablet 1   buPROPion  (WELLBUTRIN  XL) 150 MG 24 hr tablet Take 1 tablet (150 mg total) by mouth daily. 90 tablet 1   cyclobenzaprine  (FLEXERIL ) 5 MG tablet Take 1 tablet (5 mg total) by mouth 3 (three) times daily as needed for muscle spasms. 30 tablet 1   diphenhydrAMINE (BENADRYL) 25  MG tablet Take 25 mg by mouth every 6 (six) hours as needed for allergies.     Multiple Vitamins-Minerals (MULTIVITAMIN WITH MINERALS) tablet Take 1 tablet by mouth daily.     predniSONE  (DELTASONE ) 20 MG tablet Take 2 tablets (40 mg total) by mouth daily with breakfast. 10 tablet 0   metFORMIN  (GLUCOPHAGE -XR) 750 MG 24 hr tablet Take 1 tablet once daily with breakfast for 2 weeks then increase to 2 tablets once daily with breakfast (Patient not taking: Reported on 06/24/2024) 180 tablet 0   No current facility-administered medications for this visit.   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 : Never true  Transportation Needs: No Transportation Needs (07/27/2023)   Received from [facility]    In the past 12 months, has lack of reliable transportation kept you from medical appointments, meetings, work or from getting things needed  for daily living? : No  Physical Activity: Not on file  Stress: Not on file  Social Connections: Not on file  Intimate Partner Violence: Not on file   Review of Systems  Constitutional: Negative.  Negative for chills and fever.  HENT:  Negative for congestion and sore throat.   Respiratory: Negative.  Negative for cough and shortness of breath.   Cardiovascular: Negative.  Negative for chest pain and palpitations.  Gastrointestinal:  Negative for abdominal pain, diarrhea, nausea and vomiting.  Genitourinary: Negative.  Negative for dysuria and hematuria.  Skin:  Positive for itching and rash.  Neurological: Negative.  Negative for dizziness and headaches.  All other systems reviewed and are negative.  Objective  Alert and x 3 in no apparent respiratory distress Hives present on neck and arms Vitals as reported by the patient: There were no vitals filed for this visit. Problem List Items Addressed This Visit       Musculoskeletal and Integument   Allergic dermatitis - Primary   Clinically stable.  No red flag signs or symptoms Continue over-the-counter antihistamine Start Medrol  Dosepak ED precautions given Advised to contact the office if no better or worse during the next several days.      Relevant Medications   methylPREDNISolone  (MEDROL  DOSEPAK) 4 MG TBPK tablet    I discussed the assessment and treatment plan with the patient. The patient was provided an opportunity to ask questions and all were answered. The patient agreed with the plan and demonstrated an understanding of the instructions.   The patient was advised to call back or seek an in-person evaluation if the symptoms worsen or if the condition fails to improve as anticipated.  I personally spent a total of 30 minutes in the care of the patient today including preparing to see the patient, getting/reviewing separately obtained history, counseling and educating, placing orders, documenting clinical information  in the EHR, coordinating care, and management of allergic reactions with antihistamines and systemic corticosteroids, ED precautions and prognosis and need for follow-up if no better or worse during the next several days.    Dr. Emil Schaumann, MD [place] [facility] at [facility]

## 2024-06-24 NOTE — Assessment & Plan Note (Signed)
 Clinically stable.  No red flag signs or symptoms Continue over-the-counter antihistamine Start Medrol  Dosepak ED precautions given Advised to contact the office if no better or worse during the next several days.

## 2024-06-25 ENCOUNTER — Telehealth: Payer: Self-pay

## 2024-06-25 NOTE — Telephone Encounter (Signed)
 Copied from CRM [id number]. Topic: General - Other >> Jun 25, 2024  2:15 PM Robinson H wrote: Reason for CRM: Patient calling to see if she can have a doctors note for missing work today due to condition she came in for appointment on yesterday for  Randine [phone]

## 2024-06-26 NOTE — Telephone Encounter (Signed)
 Patient was seen by Dr Purcell but he is not here are you able to send in work note for patient for missing yesterday

## 2024-06-27 NOTE — Telephone Encounter (Signed)
Please do.  Thanks.

## 2024-08-13 ENCOUNTER — Ambulatory Visit: Payer: Self-pay

## 2024-08-13 NOTE — Telephone Encounter (Signed)
 FYI Only or Action Required?: FYI only for provider: appointment scheduled on 08/22/24 physical, calling back to schedule acute visit.  Patient was last seen in primary care on 06/24/2024 by Purcell Emil Schanz, MD.  Called Nurse Triage reporting Appointment.  Symptoms began several days ago.  Interventions attempted: Rest, hydration, or home remedies.  Symptoms are: unchanged.  Triage Disposition: See PCP Within 2 Weeks  Patient/caregiver understands and will follow disposition?: Yes    Copied from CRM [id number]. Topic: Clinical - Red Word Triage >> Aug 13, 2024  8:46 AM Frederich PARAS wrote: [facility] that prompted transfer to Nurse Triage: high blood pressure , anxiety  Pt called in to schedule a physical. While answering questions pt stated she had a worsening issue. The worsening issue the pt says she has is her blood pressure being higher. Pt also says her anxiety is worsening. I was denied scheduling for pt was Directed to NT Reason for Disposition  Patient wants doctor (or NP/PA) to measure BP  Answer Assessment - Initial Assessment Questions Additional info:  1) Patient called in to schedule her annual exam. She would also like an acute appointment to measure blood pressure and discuss symptoms. She is currently asymptomatic but states I know it is high and would not elaborate. She is not measuring her blood pressure. She is also experiencing some increased anxiety.  2) This writer scheduled next available physical appointment on 08/22/24 and added to wait list. Offered acute visit with alternate provider which patient is interested in scheduling with female provider on 08/15/24, patient is needing to check her work schedule and will call back in later today to schedule acute appointment on Friday 08/15/24  1. BLOOD PRESSURE: What is your blood pressure? Did you take at least two measurements 5 minutes apart?     Not measured just fells like high but no symptoms at this time 2.  ONSET: When did you take your blood pressure?     Done  3. HOW: How did you take your blood pressure? (e.g., automatic home BP monitor, visiting nurse)     Not measured  4. HISTORY: Do you have a history of high blood pressure?     yes 5. MEDICINES: Are you taking any medicines for blood pressure? Have you missed any doses recently?     no 6. OTHER SYMPTOMS: Do you have any symptoms? (e.g., blurred vision, chest pain, difficulty breathing, headache, weakness)     Anxiety. Denies all other symptoms but states  I know it is high  Protocols used: Blood Pressure - High-A-AH

## 2024-08-15 NOTE — Telephone Encounter (Signed)
 Pt has appt on 08/22/24@940am  to see Dr. Rollene for a physical.

## 2024-08-22 ENCOUNTER — Ambulatory Visit: Payer: Self-pay | Admitting: Internal Medicine

## 2024-08-22 ENCOUNTER — Encounter: Payer: Self-pay | Admitting: Internal Medicine

## 2024-08-22 VITALS — BP 138/80 | HR 80 | Temp 98.4°F | Ht 65.0 in | Wt 232.0 lb

## 2024-08-22 DIAGNOSIS — I10 Essential (primary) hypertension: Secondary | ICD-10-CM

## 2024-08-22 DIAGNOSIS — Z23 Encounter for immunization: Secondary | ICD-10-CM | POA: Diagnosis not present

## 2024-08-22 DIAGNOSIS — Z0000 Encounter for general adult medical examination without abnormal findings: Secondary | ICD-10-CM

## 2024-08-22 DIAGNOSIS — R7303 Prediabetes: Secondary | ICD-10-CM | POA: Diagnosis not present

## 2024-08-22 DIAGNOSIS — F411 Generalized anxiety disorder: Secondary | ICD-10-CM

## 2024-08-22 MED ORDER — ZEPBOUND 5 MG/0.5ML ~~LOC~~ SOAJ: 5.0000 mg | SUBCUTANEOUS | 0 refills | Status: AC

## 2024-08-22 MED ORDER — ZEPBOUND 2.5 MG/0.5ML ~~LOC~~ SOAJ: 2.5000 mg | SUBCUTANEOUS | 0 refills | Status: AC

## 2024-08-22 MED ORDER — ZEPBOUND 10 MG/0.5ML ~~LOC~~ SOAJ: 10.0000 mg | SUBCUTANEOUS | 0 refills | Status: AC

## 2024-08-22 MED ORDER — BUPROPION HCL ER (XL) 150 MG PO TB24: 150.0000 mg | ORAL_TABLET | Freq: Every day | ORAL | 1 refills | Status: AC

## 2024-08-22 MED ORDER — ZEPBOUND 7.5 MG/0.5ML ~~LOC~~ SOAJ: 7.5000 mg | SUBCUTANEOUS | 0 refills | Status: AC

## 2024-08-22 NOTE — Assessment & Plan Note (Signed)
 BP is at goal without meds today. She has had an elevated reading at home but not checking regularly. We asked her to monitor BP weekly and let us  know readings and if >140/90 we will start medicine.

## 2024-08-22 NOTE — Assessment & Plan Note (Signed)
 Rx wellbutrin  150 mg daily and in 2-3 weeks can increase to 300 mg daily if desired. Let us  know in 1 month if this is effective.

## 2024-08-22 NOTE — Assessment & Plan Note (Signed)
 Flu shot up to date. Tetanus due, pap smear up to date with gyn. Counseled about sun safety and mole surveillance. Counseled about the dangers of distracted driving. Given 10 year screening recommendations.

## 2024-08-22 NOTE — Progress Notes (Signed)
   Subjective:   Patient ID: Amy Francis Daring, female    DOB: 03-26-1986, 38 y.o.   MRN: [id number]  The patient is here for physical. Pertinent topics discussed: Discussed the use of AI scribe software for clinical note transcription with the patient, who gave verbal consent to proceed.  History of Present Illness Amy Francis is a 38 year old female with hypertension who presents with concerns about elevated blood pressure and medication management.  She has noticed elevated blood pressure readings, with the highest recorded at 160 mmHg. She does not check her blood pressure frequently due to anxiety. To manage her blood pressure, she has been taking beetroot supplements and increasing her water intake.  She stopped taking bupropion  due to concerns about it potentially raising her blood pressure, which she read online. She is considering restarting it as she believes it may help with her ADHD symptoms, which include difficulty focusing and feeling mentally scattered. Previously, she was on a dose of 150 mg but did not notice significant improvement.  She has a history of anxiety, which she attributes partly to her mother's early death at age 63. She is worried about her health and its impact on her daughter.  She has been using weight loss injections, which she found effective, reducing her weight from 233 lbs to 217-218 lbs. However, she stopped due to the cost. Her A1c was previously elevated at 6.1%.  She experiences occasional skin sensitivity, describing it as 'weird to touch' or 'burning', but it resolves on its own. No specific trigger has been identified for these sensations.  No new chest pains, breathing problems, or gastrointestinal issues. She reports occasional swelling in her ankles, which she notices more in the afternoons.  PMH, [facility], social history reviewed and updated  Review of Systems  Constitutional: Negative.   HENT: Negative.    Eyes: Negative.   Respiratory:   Negative for cough, chest tightness and shortness of breath.   Cardiovascular:  Negative for chest pain, palpitations and leg swelling.  Gastrointestinal:  Negative for abdominal distention, abdominal pain, constipation, diarrhea, nausea and vomiting.  Musculoskeletal: Negative.   Skin: Negative.   Neurological: Negative.   Psychiatric/Behavioral: Negative.      Objective:  Physical Exam Constitutional:      Appearance: She is well-developed.  HENT:     Head: Normocephalic and atraumatic.  Cardiovascular:     Rate and Rhythm: Normal rate and regular rhythm.  Pulmonary:     Effort: Pulmonary effort is normal. No respiratory distress.     Breath sounds: Normal breath sounds. No wheezing or rales.  Abdominal:     General: Bowel sounds are normal. There is no distension.     Palpations: Abdomen is soft.     Tenderness: There is no abdominal tenderness.  Musculoskeletal:     Cervical back: Normal range of motion.  Skin:    General: Skin is warm and dry.  Neurological:     Mental Status: She is alert and oriented to person, place, and time.     Coordination: Coordination normal.     Vitals:   08/22/24 0940 08/22/24 0951  BP: (!) 150/90 138/80  Pulse: 80   Temp: 98.4 F (36.9 C)   TempSrc: Oral   SpO2: 95%   Weight: 232 lb (105.2 kg)   Height: 5' 5 (1.651 m)     Assessment & Plan:  Flu shot given at visit

## 2024-08-22 NOTE — Assessment & Plan Note (Signed)
 Checking HgA1c and adjust as needed.

## 2024-08-22 NOTE — Assessment & Plan Note (Signed)
 Rx zepbound  to see if this is covered and counseled about diet and lifestyle changes which she will maintain as well.

## 2024-08-22 NOTE — Patient Instructions (Addendum)
 We will have you start checking the blood pressure and let us  know what this is running. The goal for blood pressure is <140/<90  We will send in the zepbound  to the pharmacy to see if they will cover this.  We will send in the wellbutrin  to take 1 pill daily for first 2-3 weeks. Then you can increase to 2 pills daily and let us  know if this is working.

## 2024-08-25 ENCOUNTER — Telehealth: Payer: Self-pay

## 2024-08-25 ENCOUNTER — Other Ambulatory Visit (HOSPITAL_COMMUNITY): Payer: Self-pay

## 2024-08-25 NOTE — Telephone Encounter (Signed)
 Pharmacy Patient Advocate Encounter   Received notification from Patient Pharmacy that prior authorization for Zepbound  2.5mg /0.16ml is required/requested.   Insurance verification completed.   The patient is insured through [employer]   Per test claim: Per test claim, medication is not covered due to plan/benefit exclusion, PA not submitted at this time

## 2024-09-22 ENCOUNTER — Telehealth: Payer: Self-pay

## 2024-09-22 NOTE — Telephone Encounter (Signed)
 Copied from CRM #[id number]. Topic: Clinical - Medical Advice >> Sep 22, 2024  2:21 PM Harlene ORN wrote: Reason for CRM: Patient called to discuss with the nurse about her blood pressure readings. Please call back to discuss.

## 2024-09-26 NOTE — Telephone Encounter (Signed)
 Does she wish to start BP medication? We had talked about starting meds for readings consistently above 140/90 at our visit 1 month ago. If medicine needed we would start amlodipine .

## 2024-09-29 NOTE — Telephone Encounter (Signed)
 Spoke with pt to f/u on BP. Stated that she has not checked BP recently. Pt was advised by Dr. Rollene to check once a week for a month. Pt also stated that she has increased water intake and is walking/exercising more. Informed pt of medication per Dr. Rollene. Pt would like to continue current regiment to see if this is helpful prior to being placed on medication. Advised pt to contact office if she has any other concerns.
